# Patient Record
Sex: Female | Born: 1970 | Race: Black or African American | Hispanic: No | Marital: Single | Smoking: Former smoker
Health system: Southern US, Community
[De-identification: ages and names within clinical notes are randomized; demographics above are authoritative.]

## PROBLEM LIST (undated history)

## (undated) DIAGNOSIS — T7840XA Allergy, unspecified, initial encounter: Secondary | ICD-10-CM

## (undated) DIAGNOSIS — Z5189 Encounter for other specified aftercare: Secondary | ICD-10-CM

## (undated) DIAGNOSIS — F419 Anxiety disorder, unspecified: Secondary | ICD-10-CM

## (undated) DIAGNOSIS — B009 Herpesviral infection, unspecified: Secondary | ICD-10-CM

## (undated) DIAGNOSIS — D649 Anemia, unspecified: Secondary | ICD-10-CM

## (undated) DIAGNOSIS — D219 Benign neoplasm of connective and other soft tissue, unspecified: Secondary | ICD-10-CM

## (undated) DIAGNOSIS — N289 Disorder of kidney and ureter, unspecified: Secondary | ICD-10-CM

## (undated) DIAGNOSIS — U071 COVID-19: Secondary | ICD-10-CM

## (undated) DIAGNOSIS — I10 Essential (primary) hypertension: Secondary | ICD-10-CM

## (undated) HISTORY — DX: Anxiety disorder, unspecified: F41.9

## (undated) HISTORY — DX: Herpesviral infection, unspecified: B00.9

## (undated) HISTORY — DX: Allergy, unspecified, initial encounter: T78.40XA

## (undated) HISTORY — DX: Benign neoplasm of connective and other soft tissue, unspecified: D21.9

## (undated) HISTORY — DX: Encounter for other specified aftercare: Z51.89

## (undated) HISTORY — DX: Anemia, unspecified: D64.9

## (undated) HISTORY — PX: LITHOTRIPSY: SUR834

## (undated) HISTORY — DX: COVID-19: U07.1

## (undated) HISTORY — PX: GYNECOLOGIC CRYOSURGERY: SHX857

## (undated) HISTORY — DX: Essential (primary) hypertension: I10

## (undated) HISTORY — PX: OTHER SURGICAL HISTORY: SHX169

---

## 2001-09-10 ENCOUNTER — Emergency Department (HOSPITAL_COMMUNITY): Admission: EM | Admit: 2001-09-10 | Discharge: 2001-09-10 | Payer: Self-pay | Admitting: *Deleted

## 2001-09-12 ENCOUNTER — Encounter: Payer: Self-pay | Admitting: Family Medicine

## 2001-09-12 ENCOUNTER — Ambulatory Visit (HOSPITAL_COMMUNITY): Admission: RE | Admit: 2001-09-12 | Discharge: 2001-09-12 | Payer: Self-pay | Admitting: Family Medicine

## 2001-11-06 ENCOUNTER — Other Ambulatory Visit: Admission: RE | Admit: 2001-11-06 | Discharge: 2001-11-06 | Payer: Self-pay

## 2002-03-27 ENCOUNTER — Ambulatory Visit (HOSPITAL_COMMUNITY): Admission: RE | Admit: 2002-03-27 | Discharge: 2002-03-27 | Payer: Self-pay | Admitting: Family Medicine

## 2002-03-27 ENCOUNTER — Encounter: Payer: Self-pay | Admitting: Family Medicine

## 2003-08-16 ENCOUNTER — Other Ambulatory Visit: Admission: RE | Admit: 2003-08-16 | Discharge: 2003-08-16 | Payer: Self-pay | Admitting: Family Medicine

## 2014-05-05 ENCOUNTER — Encounter: Payer: Self-pay | Admitting: Gynecology

## 2014-05-05 ENCOUNTER — Ambulatory Visit (INDEPENDENT_AMBULATORY_CARE_PROVIDER_SITE_OTHER): Payer: 59 | Admitting: Gynecology

## 2014-05-05 VITALS — BP 130/80 | Ht 69.0 in | Wt 217.0 lb

## 2014-05-05 DIAGNOSIS — D251 Intramural leiomyoma of uterus: Secondary | ICD-10-CM

## 2014-05-05 DIAGNOSIS — N898 Other specified noninflammatory disorders of vagina: Secondary | ICD-10-CM

## 2014-05-05 DIAGNOSIS — Z113 Encounter for screening for infections with a predominantly sexual mode of transmission: Secondary | ICD-10-CM

## 2014-05-05 DIAGNOSIS — N920 Excessive and frequent menstruation with regular cycle: Secondary | ICD-10-CM

## 2014-05-05 DIAGNOSIS — Z308 Encounter for other contraceptive management: Secondary | ICD-10-CM

## 2014-05-05 LAB — WET PREP FOR TRICH, YEAST, CLUE
Trich, Wet Prep: NONE SEEN
Yeast Wet Prep HPF POC: NONE SEEN

## 2014-05-05 MED ORDER — CLINDAMYCIN PHOSPHATE 2 % VA CREA: 1.0000 | TOPICAL_CREAM | Freq: Every day | VAGINAL | Status: DC

## 2014-05-05 NOTE — Progress Notes (Signed)
Danielle Hawkins 08/19/1970 [id number]        43 y.o.  G0P0 new patient presents with several issues:  1. Vaginal discharge with odor over the past several weeks. Some irritation associated with this. No real itching. No urinary symptoms such as frequency discharge irritation. 2. Requests STD screening. Is in a new relationship and requests screening. No known exposure.  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 3. History of menorrhagia with leiomyoma. Started on oral contraceptives for control. Apparently had significant menorrhagia requiring transfusion in the past. Menses are lighter now.  Past medical history,surgical history, problem list, medications, allergies, family history and social history were all reviewed and documented in the EPIC chart.  Directed ROS with pertinent positives and negatives documented in the history of present illness/assessment and plan.  Exam: [name] assistant General appearance:  Normal Spine straight without CVA tenderness Abdomen soft nontender without masses guarding rebound organomegaly. Pelvic external BUS vagina with white discharge. Cervix grossly normal. Uterus irregular consistent with leiomyoma but grossly normal to upper limits of normal in size. Adnexa without masses or tenderness.  Perineum/perianal region grossly normal without evidence of lesions or condyloma.  Assessment/Plan:  43 y.o. G0P0 with :  1. White discharge positive for bacterial vaginosis on wet prep. Options for treatment reviewed and patient elected for Cleocin vaginal cream nightly x7 days. Follow up if symptoms persist, worsen or recur. 2. STD screening. GC/Chlamydia, hepatitis B, hepatitis C, RPR, HIV ordered. I reviewed the issues of sero-positive HSV which could reflect "cold sores" and not be genital. She's never had a clinical outbreak. I also reviewed the possibility of subclinical shedding genitally. At this point she'll report any lesions if  they occur. 3. History of leiomyoma/menorrhagia/contraceptive management. On oral contraceptives for control. Doing well. She does give a history of occasional smoking when she goes out socially which amounts to several cigarettes monthly. I reviewed with her the increased risk of blood clots to include stroke heart attack DVT associated with smoking and birth control pills particularly over the age of 77. It does not sound like she smokes heavily but I discussed with her no guarantees that this is not adversely affecting her risk. Alternatives for contraception reviewed to include Mirena IUD which would also address the menorrhagia issue hopefully. She was told that where her fibroids were in the past that it would be dangerous for her to achieve pregnancy. Unclear if they were referring to a submucosal location. Ultimately I recommended she proceed with sonohysterogram for better definition of her baseline leiomyoma and to assess the cavity.  If submucous myomas present options for hysteroscopic myomectomy reviewed. If cavity is clear options for Mirena IUD discussed. I strongly recommended she consider this and she is going to think this over.  4. Health maintenance. Patient is overdue for an annual exam and routine blood workup record and she scheduled this and she will go and do so.  Does report being up-to-date on her mammogram. She does have a history of abnormal Pap smear requiring cryosurgery in her early 54s but is unclear of the severity of the atypia. Her Pap smears are reportedly normal since then which [place].    [name], 3:32 PM 05/05/2014

## 2014-05-05 NOTE — Patient Instructions (Addendum)
Use the Cleocin vaginal cream nightly for 7 days.  Follow up if the vaginal discharge or irritation continues. Follow up for ultrasound as scheduled. Schedule a full GYN checkup exam.  Lab results are available on My Chart within one to 2 weeks.

## 2014-05-06 LAB — HEPATITIS C ANTIBODY: HCV Ab: NEGATIVE

## 2014-05-06 LAB — URINALYSIS W MICROSCOPIC + REFLEX CULTURE
Bacteria, UA: NONE SEEN
Bilirubin Urine: NEGATIVE
CRYSTALS: NONE SEEN
Casts: NONE SEEN
GLUCOSE, UA: NEGATIVE mg/dL
Ketones, ur: NEGATIVE mg/dL
Leukocytes, UA: NEGATIVE
Nitrite: NEGATIVE
Protein, ur: NEGATIVE mg/dL
SPECIFIC GRAVITY, URINE: 1.023 (ref 1.005–1.030)
SQUAMOUS EPITHELIAL / LPF: NONE SEEN
UROBILINOGEN UA: 0.2 mg/dL (ref 0.0–1.0)
pH: 6 (ref 5.0–8.0)

## 2014-05-06 LAB — HIV ANTIBODY (ROUTINE TESTING W REFLEX): HIV 1&2 Ab, 4th Generation: NONREACTIVE

## 2014-05-06 LAB — HEPATITIS B SURFACE ANTIGEN: Hepatitis B Surface Ag: NEGATIVE

## 2014-05-06 LAB — RPR

## 2014-05-07 ENCOUNTER — Telehealth: Payer: Self-pay | Admitting: *Deleted

## 2014-05-07 LAB — URINE CULTURE
Colony Count: NO GROWTH
ORGANISM ID, BACTERIA: NO GROWTH

## 2014-05-07 LAB — GC/CHLAMYDIA PROBE AMP
CT Probe RNA: NEGATIVE
GC Probe RNA: NEGATIVE

## 2014-05-07 NOTE — Telephone Encounter (Signed)
Pt called requesting lab results from 05/05/14 GC/Chlamydia, i called pt and informed result not back yet.

## 2014-05-10 ENCOUNTER — Other Ambulatory Visit: Payer: Self-pay | Admitting: *Deleted

## 2014-05-10 DIAGNOSIS — R319 Hematuria, unspecified: Secondary | ICD-10-CM

## 2014-05-11 ENCOUNTER — Other Ambulatory Visit: Payer: 59

## 2014-05-11 DIAGNOSIS — R319 Hematuria, unspecified: Secondary | ICD-10-CM

## 2014-05-12 LAB — URINALYSIS W MICROSCOPIC + REFLEX CULTURE
BACTERIA UA: NONE SEEN
Bilirubin Urine: NEGATIVE
CASTS: NONE SEEN
CRYSTALS: NONE SEEN
Glucose, UA: NEGATIVE mg/dL
KETONES UR: NEGATIVE mg/dL
LEUKOCYTES UA: NEGATIVE
NITRITE: NEGATIVE
Protein, ur: NEGATIVE mg/dL
SPECIFIC GRAVITY, URINE: 1.024 (ref 1.005–1.030)
Urobilinogen, UA: 0.2 mg/dL (ref 0.0–1.0)
pH: 6 (ref 5.0–8.0)

## 2014-05-13 ENCOUNTER — Telehealth: Payer: Self-pay | Admitting: *Deleted

## 2014-05-13 LAB — URINE CULTURE: Colony Count: 9000

## 2014-05-13 NOTE — Telephone Encounter (Signed)
Pt was told to come back and repeat u/a on 05/05/14 due to fair amount of blood in urine, pt had repeat on 05/11/14 still shows blood. Pt said she has history of kidney stones and lipotripsy done back in march 2015. Please advise

## 2014-05-13 NOTE — Telephone Encounter (Signed)
It sounds like she was well evaluated by urology and I do not think further workup at this point is needed.  I am culturing it just to make sure it does not grow out bacteria.

## 2014-05-13 NOTE — Telephone Encounter (Signed)
Left the below on pt voicemail. 

## 2014-05-18 ENCOUNTER — Telehealth: Payer: Self-pay

## 2014-05-18 NOTE — Telephone Encounter (Signed)
Patient called back in voice mail and said she was having white discharge and tingling but not itching and no odor. She told me she had scheduled appt with Dr. Loetta Rough for Thursday because she wants to make sure what is going on.  She said I only needed to call her back if i needed to.  I will not call her back since office visit best advice and she has it scheduled.

## 2014-05-18 NOTE — Telephone Encounter (Signed)
Patient called stating she finished Cleocin cream but now suspects possible yeast infection due to white discharge. I called her back and left message to call me so I can get more detail about her symptoms.

## 2014-05-20 ENCOUNTER — Encounter: Payer: Self-pay | Admitting: Gynecology

## 2014-05-20 ENCOUNTER — Ambulatory Visit (INDEPENDENT_AMBULATORY_CARE_PROVIDER_SITE_OTHER): Payer: 59 | Admitting: Gynecology

## 2014-05-20 DIAGNOSIS — B3731 Acute candidiasis of vulva and vagina: Secondary | ICD-10-CM

## 2014-05-20 DIAGNOSIS — B373 Candidiasis of vulva and vagina: Secondary | ICD-10-CM

## 2014-05-20 DIAGNOSIS — N898 Other specified noninflammatory disorders of vagina: Secondary | ICD-10-CM

## 2014-05-20 LAB — WET PREP FOR TRICH, YEAST, CLUE: TRICH WET PREP: NONE SEEN

## 2014-05-20 MED ORDER — TERCONAZOLE 0.8 % VA CREA: 1.0000 | TOPICAL_CREAM | Freq: Every day | VAGINAL | Status: DC

## 2014-05-20 NOTE — Patient Instructions (Signed)
Use the Terazol three-day cream nightly 3 days. Follow up if symptoms persist, worsen or recur.

## 2014-05-20 NOTE — Progress Notes (Signed)
Danielle Hawkins 06/10/1971 [id number]        43 y.o.  G0P0 Presents with ongoing vaginal discharge. Was treated for bacterial vaginosis last month and notes that her odor and heavier discharge resolved but now she has more of an itch with white discharge. No urinary symptoms such as frequency dysuria urgency.  Past medical history,surgical history, problem list, medications, allergies, family history and social history were all reviewed and documented in the EPIC chart.  Directed ROS with pertinent positives and negatives documented in the history of present illness/assessment and plan.  Exam: [name] assistant General appearance:  Normal Abdomen soft without masses guarding rebound Pelvic external be less vagina with white clumpy discharge. Cervix normal. Uterus normal size midline mobile nontender. Adnexa without masses or tenderness.  Assessment/Plan:  43 y.o. G0P0 history, exam and wet prep consistent with yeast vaginitis. Is allergic to oral Diflucan but can use vaginal yeast creams. Terazol 3 day cream prescribed. Follow up if symptoms persist, worsen or recur.     [name], 12:17 PM 05/20/2014

## 2014-06-07 ENCOUNTER — Telehealth: Payer: Self-pay

## 2014-06-07 NOTE — Telephone Encounter (Signed)
C/o yeast infection symptoms. Danielle Hawkins they never went completely away after treatment. She said she had some sort of reaction to Terazol 7 that caused her to be very swollen and itching. She called Dr. On call who said impossible to know if swelling and itching from yeast or med reaction so she told her to d/c it. She told her that she had used it long enough (3-4 days) that it should have cleared up the yeast.  Patient wondered what you recommend now and also asked why she keeps getting yeast infections. (of note she is allergic to Diflucan)

## 2014-06-08 ENCOUNTER — Other Ambulatory Visit: Payer: Self-pay | Admitting: Gynecology

## 2014-06-08 MED ORDER — NONFORMULARY OR COMPOUNDED ITEM: Status: DC

## 2014-06-08 NOTE — Telephone Encounter (Signed)
Left message to call.

## 2014-06-08 NOTE — Telephone Encounter (Signed)
I would recommend trial of boric acid suppositories 600 mg intravaginal 3 times weekly #36 for 3 months and see if this doesn't suppress her.

## 2014-06-08 NOTE — Telephone Encounter (Signed)
Patient called. Advised of Dr. Dorette Grate recommendation and Rx sent.

## 2014-06-09 ENCOUNTER — Telehealth: Payer: Self-pay | Admitting: *Deleted

## 2014-06-09 NOTE — Telephone Encounter (Signed)
Pt walked in yesterday with a question on how to insert the boric acid capsules without using her finger. I left a message last night and this morning to return the call. She can use a plastic tampon applicator or ask the pharmacist for an applicator. I did not leave this message on her VM. I did let [name] at our front desk know this in case the patient walks in for the answer again. If there are more questions the pt or [name] can get in touch with me. KW CMA

## 2014-06-09 NOTE — Telephone Encounter (Signed)
Pt called back - I went over the tampon applicator (preferably the plastic one) use for inserting Boric Acid capsules. SHe asked if she could use it back to back for several days since has Yeast symptoms now, Verbally per TF yes for 7 days then three times a week. Left that response and tampons at front desk for pt to pick up today. KW CMA

## 2015-01-06 ENCOUNTER — Ambulatory Visit (INDEPENDENT_AMBULATORY_CARE_PROVIDER_SITE_OTHER): Payer: 59 | Admitting: Family Medicine

## 2015-01-06 VITALS — BP 154/86 | HR 82 | Temp 98.3°F | Resp 16 | Ht 69.0 in | Wt 215.0 lb

## 2015-01-06 DIAGNOSIS — F418 Other specified anxiety disorders: Secondary | ICD-10-CM | POA: Diagnosis not present

## 2015-01-06 DIAGNOSIS — R002 Palpitations: Secondary | ICD-10-CM

## 2015-01-06 DIAGNOSIS — Z8249 Family history of ischemic heart disease and other diseases of the circulatory system: Secondary | ICD-10-CM | POA: Diagnosis not present

## 2015-01-06 DIAGNOSIS — R9431 Abnormal electrocardiogram [ECG] [EKG]: Secondary | ICD-10-CM | POA: Diagnosis not present

## 2015-01-06 DIAGNOSIS — R42 Dizziness and giddiness: Secondary | ICD-10-CM

## 2015-01-06 DIAGNOSIS — F4329 Adjustment disorder with other symptoms: Secondary | ICD-10-CM | POA: Diagnosis not present

## 2015-01-06 DIAGNOSIS — I10 Essential (primary) hypertension: Secondary | ICD-10-CM | POA: Diagnosis not present

## 2015-01-06 LAB — POCT CBC
Granulocyte percent: 73.7 %G (ref 37–80)
HEMATOCRIT: 35.3 % — AB (ref 37.7–47.9)
HEMOGLOBIN: 11.4 g/dL — AB (ref 12.2–16.2)
Lymph, poc: 1.3 (ref 0.6–3.4)
MCH, POC: 25 pg — AB (ref 27–31.2)
MCHC: 32.2 g/dL (ref 31.8–35.4)
MCV: 77.6 fL — AB (ref 80–97)
MID (cbc): 0.2 (ref 0–0.9)
MPV: 7.6 fL (ref 0–99.8)
POC Granulocyte: 4.1 (ref 2–6.9)
POC LYMPH %: 22.5 % (ref 10–50)
POC MID %: 3.8 %M (ref 0–12)
Platelet Count, POC: 334 10*3/uL (ref 142–424)
RBC: 4.55 M/uL (ref 4.04–5.48)
RDW, POC: 14.5 %
WBC: 5.6 10*3/uL (ref 4.6–10.2)

## 2015-01-06 LAB — GLUCOSE, POCT (MANUAL RESULT ENTRY): POC GLUCOSE: 84 mg/dL (ref 70–99)

## 2015-01-06 MED ORDER — METOPROLOL TARTRATE 25 MG PO TABS: 25.0000 mg | ORAL_TABLET | Freq: Two times a day (BID) | ORAL | Status: DC

## 2015-01-06 MED ORDER — ALPRAZOLAM 0.5 MG PO TABS: 0.5000 mg | ORAL_TABLET | Freq: Two times a day (BID) | ORAL | Status: DC | PRN

## 2015-01-06 MED ORDER — CHLORTHALIDONE 25 MG PO TABS: 12.5000 mg | ORAL_TABLET | Freq: Every day | ORAL | Status: DC

## 2015-01-06 NOTE — Progress Notes (Addendum)
Subjective:  This chart was scribed for [name], MD by [facility], medical scribe at [facility].The patient was seen in exam room 11 and the patient's care was started at 4:34 PM.   Patient ID: Danielle Hawkins, female    DOB: 1971/01/08, 44 y.o.   MRN: [id number] Chief Complaint  Patient presents with  . Dizziness    started about an hour ago  . Hypertension    high BP started about 2 wks ago. anxiety attack brought this on.  new meds are not helping   HPI HPI Comments: Danielle Hawkins is a 44 y.o. female who presents to [facility] complaining of dizziness onset one hour pta. She is a new patient here.   Hypertension, dizziness: She is concerned about an increased in blood pressure with associated dizziness, heart palpation and lightheadedness ongoing for the last two months. Wrist chuff reading last week 160/90. Pharmacist checked her blood pressure that day which read 166/90. After the pharmacy she went to [facility] ED which showed similar blood pressure readings. She developed a headache, and anxiety as blood pressure increased. The highest blood pressure was 200/115. An IV was started and she given an oral metoprolol and medication for anxiety. She also had blood work, EKG, CT of the head and chest x-ray which were all normal. Since leaving the hospital blood pressure has been running around 150/80-90 lowest is 145/84. She does not believe her medication is working. Pt states she has white coat syndrome. She has an appointment with Domenick Bookbinder for her elevated blood pressure on 07/11. Pt has a strong family history of hypertension. She does not have a history of diabetes.  FH of cardiac disease - mom with stents in early to mid 50's.   Anxiety: History of anxiety treated with xanax as needed prescribed by her OBGYN. Lately she takes xanax every other day. She has a history of panic attacks. Pt has only taken xanax for relief. Stressed because she  is taking care of her mom over this past year. No suicidal ideation, self injury, alcohol use or illicit drug use.  There are no active problems to display for this patient.  Past Medical History  Diagnosis Date  . Fibroid   . Anemia   . Anxiety   . Encounter for blood transfusion   . HSV infection     seropositive.  Negative clinical history  . Allergy   . Blood transfusion without reported diagnosis   . Hypertension    Past Surgical History  Procedure Laterality Date  . Lithotripsy    . Keloid removal    . Gynecologic cryosurgery  early 20's   Allergies  Allergen Reactions  . Diflucan [Fluconazole]   . Niacin And Related   . Other Itching    IV dye  . Percocet [Oxycodone-Acetaminophen]     Itching and insomnia  . Red Dye   . Tramadol Itching   Prior to Admission medications   Medication Sig Start Date End Date Taking? Authorizing Provider  ALPRAZolam Duanne Moron) 1 MG tablet Take 1 mg by mouth at bedtime as needed for anxiety.   Yes Historical Provider, MD  metoprolol tartrate (LOPRESSOR) 25 MG tablet Take 25 mg by mouth 2 (two) times daily.   Yes Historical Provider, MD  norgestimate-ethinyl estradiol (ORTHO-CYCLEN,SPRINTEC,PREVIFEM) 0.25-35 MG-MCG tablet Take 1 tablet by mouth daily.   Yes Historical Provider, MD  fluticasone (VERAMYST) 27.5 MCG/SPRAY nasal spray Place 2 sprays into the  nose daily.    Historical Provider, MD  NONFORMULARY OR COMPOUNDED ITEM Boric Acid Suppositories 674m #36 S: insert one caps vaginally 3x weekly. Patient not taking: Reported on 01/06/2015 06/08/14   TAnastasio Auerbach MD  terconazole (TERAZOL 3) 0.8 % vaginal cream Place 1 applicator vaginally at bedtime. For 3 nights Patient not taking: Reported on 01/06/2015 05/20/14   TAnastasio Auerbach MD   History   Social History  . Marital Status: Single    Spouse Name: N/A  . Number of Children: N/A  . Years of Education: N/A   Occupational History  . Not on file.   Social History Main  Topics  . Smoking status: Former SResearch scientist (life sciences) . Smokeless tobacco: Never Used  . Alcohol Use: 0.0 oz/week    0 Standard drinks or equivalent per week     Comment: Rare  . Drug Use: No  . Sexual Activity: Yes    Birth Control/ Protection: Pill   Other Topics Concern  . Not on file   Social History Narrative   Review of Systems  Constitutional: Negative for fatigue and unexpected weight change.  Respiratory: Negative for chest tightness and shortness of breath.   Cardiovascular: Negative for chest pain, palpitations and leg swelling.  Gastrointestinal: Negative for abdominal pain and blood in stool.  Neurological: Positive for dizziness and light-headedness. Negative for syncope and headaches.  Psychiatric/Behavioral: The patient is nervous/anxious.       Objective:  BP 154/86 mmHg  Pulse 82  Temp(Src) 98.3 F (36.8 C) (Oral)  Resp 16  Ht 5' 9"  (1.753 m)  Wt 215 lb (97.523 kg)  BMI 31.74 kg/m2  SpO2 98%  LMP 12/30/2014 Physical Exam  Constitutional: She is oriented to person, place, and time. She appears well-developed and well-nourished. No distress.  HENT:  Head: Normocephalic and atraumatic.  Eyes: Conjunctivae and EOM are normal. Pupils are equal, round, and reactive to light.  Neck: Normal range of motion. Carotid bruit is not present.  Cardiovascular: Normal rate, regular rhythm, normal heart sounds and intact distal pulses.   Pulmonary/Chest: Effort normal and breath sounds normal. No respiratory distress.  Abdominal: Soft. She exhibits no pulsatile midline mass. There is no tenderness.  Musculoskeletal: Normal range of motion.  Neurological: She is alert and oriented to person, place, and time.  Skin: Skin is warm and dry.  Psychiatric: She has a normal mood and affect. Her behavior is normal.  Nursing note and vitals reviewed. EKG: sinus rhythm, rate 78, nonspecific ST depression - inferolaterally.  No initial EKG for comparison [facility]   Results for orders placed or performed in visit on 01/06/15  POCT glucose (manual entry)  Result Value Ref Range   POC Glucose 84 70 - 99 mg/dl  POCT CBC  Result Value Ref Range   WBC 5.6 4.6 - 10.2 K/uL   Lymph, poc 1.3 0.6 - 3.4   POC LYMPH PERCENT 22.5 10 - 50 %L   MID (cbc) 0.2 0 - 0.9   POC MID % 3.8 0 - 12 %M   POC Granulocyte 4.1 2 - 6.9   Granulocyte percent 73.7 37 - 80 %G   RBC 4.55 4.04 - 5.48 M/uL   Hemoglobin 11.4 (A) 12.2 - 16.2 g/dL   HCT, POC 35.3 (A) 37.7 - 47.9 %   MCV 77.6 (A) 80 - 97 fL   MCH, POC 25.0 (A) 27 - 31.2 pg   MCHC 32.2 31.8 - 35.4 g/dL  RDW, POC 14.5 %   Platelet Count, POC 334 142 - 424 K/uL   MPV 7.6 0 - 99.8 fL  hx of borderline anemia after heavy menses, hx of fibroids - coming off period now. Has had anemia in past.     Assessment & Plan:   Danielle Hawkins is a 44 y.o. female Essential hypertension, Nonspecific abnormal electrocardiogram (ECG) (EKG), family history of cardiovascular disease - Plan: EKG 56-DJSH, Basic metabolic panel, chlorthalidone (HYGROTON) 25 MG tablet, Ambulatory referral to Cardiology, metoprolol tartrate (LOPRESSOR) 25 MG tablet  - decreased control. With possible abnormal EKG and FH of heart disease, will continue betablocker to decrease workload, add ASA 34m QD, add chlorthalidone 12.517mQD for HTN.  -refer to cardiology for outpatient eval at this point as no chest pains, but ER/911 precautions discussed if any chest pain, DOE, or new symptoms prior to cardiology eval.   Situational anxiety, with Stress and adjustment reaction  -stress mgt discussed and handout provided. May have underlying generalized anxiety with intermittent panic vs. anxiety attacks. Will start with counseling, and can continue Xanax prn for now (refilled at 0.8m28mose), but consider SSRi if persistent use/need for xanax.  Can follow up with me or scheduled follow up with PCP.   Lightheadedness, Palpitations - Plan: TSH, EKG  12-Lead  -TSH pending. Borderline HGB, but improved form her report compared to prior readings - hx of fibroids and heavy menses.  Can follow up to discuss this further.    -cardiology eval as above and RTC/ER precautions.    Meds ordered this encounter  Medications  . DISCONTD: metoprolol tartrate (LOPRESSOR) 25 MG tablet    Sig: Take 25 mg by mouth 2 (two) times daily.  . AMarland KitchenPRAZolam (XANAX) 0.5 MG tablet    Sig: Take 1 tablet (0.5 mg total) by mouth 2 (two) times daily as needed for anxiety.    Dispense:  30 tablet    Refill:  0  . chlorthalidone (HYGROTON) 25 MG tablet    Sig: Take 0.5 tablets (12.5 mg total) by mouth daily.    Dispense:  30 tablet    Refill:  1  . metoprolol tartrate (LOPRESSOR) 25 MG tablet    Sig: Take 1 tablet (25 mg total) by mouth 2 (two) times daily.    Dispense:  60 tablet    Refill:  1   Patient Instructions  You do have a possible abnormal EKG.  Continue same dose of metoprolol for now, aspirin 52m66mch day, and adding chlorthalidone (diuretic) for blood pressure.   Keep follow up with provider on the 11th to discuss blood pressure.   Work on relaIT traineree information below, and call counselor for appointment. I can refill xanax for now, but if persistent need for xanax - may want to start a daily medication such as Prozac.  Can follow up with me or there provider in next few weeks to discuss this.  AaroVivia [phone] Chaco:[phone] and Stress Management Stress is a normal reaction to life events. It is what you feel when life demands more than you are used to or more than you can handle. Some stress can be useful. For example, the stress reaction can help you catch the last bus of the day, study for a test, or meet a deadline at work. But stress that occurs too often or for too long can cause problems. It can affect your emotional health and interfere with relationships and normal daily activities.  Too much stress  can weaken your immune system and increase your risk for physical illness. If you already have a medical problem, stress can make it worse. CAUSES  All sorts of life events may cause stress. An event that causes stress for one person may not be stressful for another person. Major life events commonly cause stress. These may be positive or negative. Examples include losing your job, moving into a new home, getting married, having a baby, or losing a loved one. Less obvious life events may also cause stress, especially if they occur day after day or in combination. Examples include working long hours, driving in traffic, caring for children, being in debt, or being in a difficult relationship. SIGNS AND SYMPTOMS Stress may cause emotional symptoms including, the following:  Anxiety. This is feeling worried, afraid, on edge, overwhelmed, or out of control.  Anger. This is feeling irritated or impatient.  Depression. This is feeling sad, down, helpless, or guilty.  Difficulty focusing, remembering, or making decisions. Stress may cause physical symptoms, including the following:   Aches and pains. These may affect your head, neck, back, stomach, or other areas of your body.  Tight muscles or clenched jaw.  Low energy or trouble sleeping. Stress may cause unhealthy behaviors, including the following:   Eating to feel better (overeating) or skipping meals.  Sleeping too little, too much, or both.  Working too much or putting off tasks (procrastination).  Smoking, drinking alcohol, or using drugs to feel better. DIAGNOSIS  Stress is diagnosed through an assessment by your health care provider. Your health care provider will ask questions about your symptoms and any stressful life events.Your health care provider will also ask about your medical history and may order blood tests or other tests. Certain medical conditions and medicine can cause physical symptoms similar to stress. Mental  illness can cause emotional symptoms and unhealthy behaviors similar to stress. Your health care provider may refer you to a mental health professional for further evaluation.  TREATMENT  Stress management is the recommended treatment for stress.The goals of stress management are reducing stressful life events and coping with stress in healthy ways.  Techniques for reducing stressful life events include the following:  Stress identification. Self-monitor for stress and identify what causes stress for you. These skills may help you to avoid some stressful events.  Time management. Set your priorities, keep a calendar of events, and learn to say "no." These tools can help you avoid making too many commitments. Techniques for coping with stress include the following:  Rethinking the problem. Try to think realistically about stressful events rather than ignoring them or overreacting. Try to find the positives in a stressful situation rather than focusing on the negatives.  Exercise. Physical exercise can release both physical and emotional tension. The key is to find a form of exercise you enjoy and do it regularly.  Relaxation techniques. These relax the body and mind. Examples include yoga, meditation, tai chi, biofeedback, deep breathing, progressive muscle relaxation, listening to music, being out in nature, journaling, and other hobbies. Again, the key is to find one or more that you enjoy and can do regularly.  Healthy lifestyle. Eat a balanced diet, get plenty of sleep, and do not smoke. Avoid using alcohol or drugs to relax.  Strong support network. Spend time with family, friends, or other people you enjoy being around.Express your feelings and talk things over with someone you trust. Counseling or talktherapy with a mental health professional may  be helpful if you are having difficulty managing stress on your own. Medicine is typically not recommended for the treatment of stress.Talk to  your health care provider if you think you need medicine for symptoms of stress. HOME CARE INSTRUCTIONS  Keep all follow-up visits as directed by your health care provider.  Take all medicines as directed by your health care provider. SEEK MEDICAL CARE IF:  Your symptoms get worse or you start having new symptoms.  You feel overwhelmed by your problems and can no longer manage them on your own. SEEK IMMEDIATE MEDICAL CARE IF:  You feel like hurting yourself or someone else. Document Released: 12/26/2000 Document Revised: 11/16/2013 Document Reviewed: 02/24/2013 [facility] Patient Information 2015 [place], [place]. This information is not intended to replace advice given to you by your health care provider. Make sure you discuss any questions you have with your health care provider.  Hypertension Hypertension, commonly called high blood pressure, is when the force of blood pumping through your arteries is too strong. Your arteries are the blood vessels that carry blood from your heart throughout your body. A blood pressure reading consists of a higher number over a lower number, such as 110/72. The higher number (systolic) is the pressure inside your arteries when your heart pumps. The lower number (diastolic) is the pressure inside your arteries when your heart relaxes. Ideally you want your blood pressure below 120/80. Hypertension forces your heart to work harder to pump blood. Your arteries may become narrow or stiff. Having hypertension puts you at risk for heart disease, stroke, and other problems.  RISK FACTORS Some risk factors for high blood pressure are controllable. Others are not.  Risk factors you cannot control include:   Race. You may be at higher risk if you are African American.  Age. Risk increases with age.  Gender. Men are at higher risk than women before age 20 years. After age 57, women are at higher risk than men. Risk factors you can control include:  Not getting  enough exercise or physical activity.  Being overweight.  Getting too much fat, sugar, calories, or salt in your diet.  Drinking too much alcohol. SIGNS AND SYMPTOMS Hypertension does not usually cause signs or symptoms. Extremely high blood pressure (hypertensive crisis) may cause headache, anxiety, shortness of breath, and nosebleed. DIAGNOSIS  To check if you have hypertension, your health care provider will measure your blood pressure while you are seated, with your arm held at the level of your heart. It should be measured at least twice using the same arm. Certain conditions can cause a difference in blood pressure between your right and left arms. A blood pressure reading that is higher than normal on one occasion does not mean that you need treatment. If one blood pressure reading is high, ask your health care provider about having it checked again. TREATMENT  Treating high blood pressure includes making lifestyle changes and possibly taking medicine. Living a healthy lifestyle can help lower high blood pressure. You may need to change some of your habits. Lifestyle changes may include:  Following the DASH diet. This diet is high in fruits, vegetables, and whole grains. It is low in salt, red meat, and added sugars.  Getting at least 2 hours of brisk physical activity every week.  Losing weight if necessary.  Not smoking.  Limiting alcoholic beverages.  Learning ways to reduce stress. If lifestyle changes are not enough to get your blood pressure under control, your health care provider may  prescribe medicine. You may need to take more than one. Work closely with your health care provider to understand the risks and benefits. HOME CARE INSTRUCTIONS  Have your blood pressure rechecked as directed by your health care provider.   Take medicines only as directed by your health care provider. Follow the directions carefully. Blood pressure medicines must be taken as prescribed.  The medicine does not work as well when you skip doses. Skipping doses also puts you at risk for problems.   Do not smoke.   Monitor your blood pressure at home as directed by your health care provider. SEEK MEDICAL CARE IF:   You think you are having a reaction to medicines taken.  You have recurrent headaches or feel dizzy.  You have swelling in your ankles.  You have trouble with your vision. SEEK IMMEDIATE MEDICAL CARE IF:  You develop a severe headache or confusion.  You have unusual weakness, numbness, or feel faint.  You have severe chest or abdominal pain.  You vomit repeatedly.  You have trouble breathing. MAKE SURE YOU:   Understand these instructions.  Will watch your condition.  Will get help right away if you are not doing well or get worse. Document Released: 07/02/2005 Document Revised: 11/16/2013 Document Reviewed: 04/24/2013 [facility] Patient Information 2015 [place], [place]. This information is not intended to replace advice given to you by your health care provider. Make sure you discuss any questions you have with your health care provider.     I personally performed the services described in this documentation, which was scribed in my presence. The recorded information has been reviewed and considered, and addended by me as needed.

## 2015-01-06 NOTE — Patient Instructions (Addendum)
You do have a possible abnormal EKG.  Continue same dose of metoprolol for now, aspirin 17m each day, and adding chlorthalidone (diuretic) for blood pressure.   Keep follow up with provider on the 11th to discuss blood pressure.   Work on rIT trainer- see information below, and call counselor for appointment. I can refill xanax for now, but if persistent need for xanax - may want to start a daily medication such as Prozac.  Can follow up with me or there provider in next few weeks to discuss this.  AVivia Budge [id number] [place]:[phone] Stress and Stress Management Stress is a normal reaction to life events. It is what you feel when life demands more than you are used to or more than you can handle. Some stress can be useful. For example, the stress reaction can help you catch the last bus of the day, study for a test, or meet a deadline at work. But stress that occurs too often or for too long can cause problems. It can affect your emotional health and interfere with relationships and normal daily activities. Too much stress can weaken your immune system and increase your risk for physical illness. If you already have a medical problem, stress can make it worse. CAUSES  All sorts of life events may cause stress. An event that causes stress for one person may not be stressful for another person. Major life events commonly cause stress. These may be positive or negative. Examples include losing your job, moving into a new home, getting married, having a baby, or losing a loved one. Less obvious life events may also cause stress, especially if they occur day after day or in combination. Examples include working long hours, driving in traffic, caring for children, being in debt, or being in a difficult relationship. SIGNS AND SYMPTOMS Stress may cause emotional symptoms including, the following:  Anxiety. This is feeling worried, afraid, on edge, overwhelmed, or out of  control.  Anger. This is feeling irritated or impatient.  Depression. This is feeling sad, down, helpless, or guilty.  Difficulty focusing, remembering, or making decisions. Stress may cause physical symptoms, including the following:   Aches and pains. These may affect your head, neck, back, stomach, or other areas of your body.  Tight muscles or clenched jaw.  Low energy or trouble sleeping. Stress may cause unhealthy behaviors, including the following:   Eating to feel better (overeating) or skipping meals.  Sleeping too little, too much, or both.  Working too much or putting off tasks (procrastination).  Smoking, drinking alcohol, or using drugs to feel better. DIAGNOSIS  Stress is diagnosed through an assessment by your health care provider. Your health care provider will ask questions about your symptoms and any stressful life events.Your health care provider will also ask about your medical history and may order blood tests or other tests. Certain medical conditions and medicine can cause physical symptoms similar to stress. Mental illness can cause emotional symptoms and unhealthy behaviors similar to stress. Your health care provider may refer you to a mental health professional for further evaluation.  TREATMENT  Stress management is the recommended treatment for stress.The goals of stress management are reducing stressful life events and coping with stress in healthy ways.  Techniques for reducing stressful life events include the following:  Stress identification. Self-monitor for stress and identify what causes stress for you. These skills may help you to avoid some stressful events.  Time management. Set your priorities, keep  a calendar of events, and learn to say "no." These tools can help you avoid making too many commitments. Techniques for coping with stress include the following:  Rethinking the problem. Try to think realistically about stressful events rather  than ignoring them or overreacting. Try to find the positives in a stressful situation rather than focusing on the negatives.  Exercise. Physical exercise can release both physical and emotional tension. The key is to find a form of exercise you enjoy and do it regularly.  Relaxation techniques. These relax the body and mind. Examples include yoga, meditation, tai chi, biofeedback, deep breathing, progressive muscle relaxation, listening to music, being out in nature, journaling, and other hobbies. Again, the key is to find one or more that you enjoy and can do regularly.  Healthy lifestyle. Eat a balanced diet, get plenty of sleep, and do not smoke. Avoid using alcohol or drugs to relax.  Strong support network. Spend time with family, friends, or other people you enjoy being around.Express your feelings and talk things over with someone you trust. Counseling or talktherapy with a mental health professional may be helpful if you are having difficulty managing stress on your own. Medicine is typically not recommended for the treatment of stress.Talk to your health care provider if you think you need medicine for symptoms of stress. HOME CARE INSTRUCTIONS  Keep all follow-up visits as directed by your health care provider.  Take all medicines as directed by your health care provider. SEEK MEDICAL CARE IF:  Your symptoms get worse or you start having new symptoms.  You feel overwhelmed by your problems and can no longer manage them on your own. SEEK IMMEDIATE MEDICAL CARE IF:  You feel like hurting yourself or someone else. Document Released: 12/26/2000 Document Revised: 11/16/2013 Document Reviewed: 02/24/2013 [facility] Patient Information 2015 [place], [place]. This information is not intended to replace advice given to you by your health care provider. Make sure you discuss any questions you have with your health care provider.  Hypertension Hypertension, commonly called high blood  pressure, is when the force of blood pumping through your arteries is too strong. Your arteries are the blood vessels that carry blood from your heart throughout your body. A blood pressure reading consists of a higher number over a lower number, such as 110/72. The higher number (systolic) is the pressure inside your arteries when your heart pumps. The lower number (diastolic) is the pressure inside your arteries when your heart relaxes. Ideally you want your blood pressure below 120/80. Hypertension forces your heart to work harder to pump blood. Your arteries may become narrow or stiff. Having hypertension puts you at risk for heart disease, stroke, and other problems.  RISK FACTORS Some risk factors for high blood pressure are controllable. Others are not.  Risk factors you cannot control include:   Race. You may be at higher risk if you are African American.  Age. Risk increases with age.  Gender. Men are at higher risk than women before age 52 years. After age 40, women are at higher risk than men. Risk factors you can control include:  Not getting enough exercise or physical activity.  Being overweight.  Getting too much fat, sugar, calories, or salt in your diet.  Drinking too much alcohol. SIGNS AND SYMPTOMS Hypertension does not usually cause signs or symptoms. Extremely high blood pressure (hypertensive crisis) may cause headache, anxiety, shortness of breath, and nosebleed. DIAGNOSIS  To check if you have hypertension, your health care provider will  measure your blood pressure while you are seated, with your arm held at the level of your heart. It should be measured at least twice using the same arm. Certain conditions can cause a difference in blood pressure between your right and left arms. A blood pressure reading that is higher than normal on one occasion does not mean that you need treatment. If one blood pressure reading is high, ask your health care provider about having it  checked again. TREATMENT  Treating high blood pressure includes making lifestyle changes and possibly taking medicine. Living a healthy lifestyle can help lower high blood pressure. You may need to change some of your habits. Lifestyle changes may include:  Following the DASH diet. This diet is high in fruits, vegetables, and whole grains. It is low in salt, red meat, and added sugars.  Getting at least 2 hours of brisk physical activity every week.  Losing weight if necessary.  Not smoking.  Limiting alcoholic beverages.  Learning ways to reduce stress. If lifestyle changes are not enough to get your blood pressure under control, your health care provider may prescribe medicine. You may need to take more than one. Work closely with your health care provider to understand the risks and benefits. HOME CARE INSTRUCTIONS  Have your blood pressure rechecked as directed by your health care provider.   Take medicines only as directed by your health care provider. Follow the directions carefully. Blood pressure medicines must be taken as prescribed. The medicine does not work as well when you skip doses. Skipping doses also puts you at risk for problems.   Do not smoke.   Monitor your blood pressure at home as directed by your health care provider. SEEK MEDICAL CARE IF:   You think you are having a reaction to medicines taken.  You have recurrent headaches or feel dizzy.  You have swelling in your ankles.  You have trouble with your vision. SEEK IMMEDIATE MEDICAL CARE IF:  You develop a severe headache or confusion.  You have unusual weakness, numbness, or feel faint.  You have severe chest or abdominal pain.  You vomit repeatedly.  You have trouble breathing. MAKE SURE YOU:   Understand these instructions.  Will watch your condition.  Will get help right away if you are not doing well or get worse. Document Released: 07/02/2005 Document Revised: 11/16/2013  Document Reviewed: 04/24/2013 [facility] Patient Information 2015 [place], [place]. This information is not intended to replace advice given to you by your health care provider. Make sure you discuss any questions you have with your health care provider.

## 2015-01-07 LAB — TSH: TSH: 0.607 u[IU]/mL (ref 0.350–4.500)

## 2015-01-07 LAB — BASIC METABOLIC PANEL
BUN: 12 mg/dL (ref 6–23)
CHLORIDE: 109 meq/L (ref 96–112)
CO2: 26 meq/L (ref 19–32)
Calcium: 9.2 mg/dL (ref 8.4–10.5)
Creat: 0.93 mg/dL (ref 0.50–1.10)
Glucose, Bld: 78 mg/dL (ref 70–99)
Potassium: 4.3 mEq/L (ref 3.5–5.3)
SODIUM: 142 meq/L (ref 135–145)

## 2015-01-22 ENCOUNTER — Telehealth: Payer: Self-pay

## 2015-01-22 DIAGNOSIS — I10 Essential (primary) hypertension: Secondary | ICD-10-CM

## 2015-01-22 NOTE — Telephone Encounter (Signed)
Patient would like a refill of metoprolol tartrate (LOPRESSOR) 25 MG tablet to last her until her cardiologist appointment in August.  She wants to stick with her old BP prescription until she speaks to the cardiologist at her appointment.  CB#: [phone]

## 2015-01-24 ENCOUNTER — Ambulatory Visit: Payer: Self-pay | Admitting: Family

## 2015-01-25 MED ORDER — METOPROLOL TARTRATE 25 MG PO TABS: 25.0000 mg | ORAL_TABLET | Freq: Two times a day (BID) | ORAL | Status: DC

## 2015-01-25 NOTE — Telephone Encounter (Signed)
Not sure when her cardiologist appointment is but I provided 60 tabs of 25mg  metoprolol. Please let me know if she needs more.

## 2015-01-25 NOTE — Telephone Encounter (Signed)
Can we refill? 

## 2015-01-26 NOTE — Telephone Encounter (Signed)
Clld pt - LMOVM of cell advsng to call with Cardiologist appt time and date in August to make sure her medication refill request of Metoprolol 25 mg is adequate until her appt.

## 2015-01-26 NOTE — Telephone Encounter (Signed)
Pt cb and LMOM stating that the cardiologist office got her a RF and her next appt w/them is on 02/17/15, so she does not need for [place] to RF after all. She thanked [place] for the call.

## 2015-05-30 ENCOUNTER — Telehealth: Payer: Self-pay

## 2015-05-30 MED ORDER — ALPRAZOLAM 0.5 MG PO TABS: 0.5000 mg | ORAL_TABLET | Freq: Two times a day (BID) | ORAL | Status: DC | PRN

## 2015-05-30 NOTE — Telephone Encounter (Signed)
When seen June 23rd: "Work on IT trainer - see information below, and call counselor for appointment. I can refill xanax for now, but if persistent need for xanax - may want to start a daily medication such as Prozac. Can follow up with me or there provider in next few weeks to discuss this"  What is her follow up plan?  I can refill once, but OV needed before any further refills.

## 2015-05-30 NOTE — Telephone Encounter (Signed)
Last OV Wendie Agreste, MD at 01/06/2015 4:28 PM

## 2015-05-30 NOTE — Telephone Encounter (Signed)
Pt is requesting a refill of ALPRAZolam (XANAX) 0.5 MG tablet.  [place]

## 2015-05-31 NOTE — Telephone Encounter (Signed)
She will schedule an appt with Dr. Carlota Raspberry to discuss starting Prozac.

## 2015-05-31 NOTE — Telephone Encounter (Signed)
Spoke with pt, advised message. 

## 2015-06-02 NOTE — Telephone Encounter (Signed)
Rx called into [facility]. Left message on her VM.

## 2015-06-02 NOTE — Telephone Encounter (Signed)
Patient is calling because the pharmacy never received a refill and it needs to be resent. The medication is not in the pick up drawer.

## 2015-08-16 ENCOUNTER — Telehealth: Payer: Self-pay

## 2015-08-16 NOTE — Telephone Encounter (Signed)
I have reviewed her chart.  It looks like it has been a while since we have seen her and its good to see hypertensives about every 6 months.  No need to stop or change any meds at this point.  If she is concerned about her BP advise she start a diary, continue all meds, and RTC with her BP diary.  Thank you Junita Push.   Philis Fendt, MS, PA-C 1:01 PM, 08/16/2015

## 2015-08-16 NOTE — Telephone Encounter (Signed)
Pt states she was put on a BIRTH CONTROL MEDICATION and read where it stated it could cause an increase in her BP, we didn't prescribe the BCP's for her but we did the BP medicine Wanted to know what should she discontinue. Please call [phone]

## 2015-08-23 NOTE — Telephone Encounter (Signed)
Pt notified and she will set up an appt.

## 2015-08-23 NOTE — Telephone Encounter (Signed)
LMOM TO CB 

## 2015-09-09 ENCOUNTER — Other Ambulatory Visit: Payer: Self-pay | Admitting: Family Medicine

## 2015-09-12 ENCOUNTER — Other Ambulatory Visit: Payer: Self-pay | Admitting: Family Medicine

## 2015-09-12 NOTE — Telephone Encounter (Signed)
Pt called to check on this RF and LM stating that she knows she needs to RTC but had been told we would fill 1 mos. I will send in RF. Notified pt.

## 2015-10-14 ENCOUNTER — Ambulatory Visit (INDEPENDENT_AMBULATORY_CARE_PROVIDER_SITE_OTHER): Payer: Commercial Managed Care - HMO | Admitting: Family Medicine

## 2015-10-14 VITALS — BP 131/83 | HR 69 | Temp 98.7°F | Resp 16 | Ht 68.5 in | Wt 209.0 lb

## 2015-10-14 DIAGNOSIS — F410 Panic disorder [episodic paroxysmal anxiety] without agoraphobia: Secondary | ICD-10-CM

## 2015-10-14 DIAGNOSIS — I10 Essential (primary) hypertension: Secondary | ICD-10-CM

## 2015-10-14 DIAGNOSIS — F411 Generalized anxiety disorder: Secondary | ICD-10-CM | POA: Diagnosis not present

## 2015-10-14 LAB — BASIC METABOLIC PANEL
BUN: 12 mg/dL (ref 7–25)
CHLORIDE: 105 mmol/L (ref 98–110)
CO2: 25 mmol/L (ref 20–31)
Calcium: 8.9 mg/dL (ref 8.6–10.2)
Creat: 0.92 mg/dL (ref 0.50–1.10)
Glucose, Bld: 68 mg/dL (ref 65–99)
POTASSIUM: 3.6 mmol/L (ref 3.5–5.3)
SODIUM: 140 mmol/L (ref 135–146)

## 2015-10-14 LAB — HM PAP SMEAR: HM Pap smear: NORMAL

## 2015-10-14 MED ORDER — CHLORTHALIDONE 25 MG PO TABS: 12.5000 mg | ORAL_TABLET | Freq: Every day | ORAL | Status: DC

## 2015-10-14 MED ORDER — AMLODIPINE BESYLATE 5 MG PO TABS: 5.0000 mg | ORAL_TABLET | Freq: Every day | ORAL | Status: DC

## 2015-10-14 MED ORDER — ALPRAZOLAM 0.5 MG PO TABS: 0.5000 mg | ORAL_TABLET | Freq: Two times a day (BID) | ORAL | Status: DC | PRN

## 2015-10-14 NOTE — Patient Instructions (Addendum)
IF you received an x-ray today, you will receive an invoice from [facility]. Please contact [facility] at [phone] with questions or concerns regarding your invoice.   IF you received labwork today, you will receive an invoice from [employer]. Please contact [facility] at [phone] with questions or concerns regarding your invoice.   Our billing staff will not be able to assist you with questions regarding bills from these companies.  You will be contacted with the lab results as soon as they are available. The fastest way to get your results is to activate your My Chart account. Instructions are located on the last page of this paperwork. If you have not heard from [place] regarding the results in 2 weeks, please contact this office.    Change metoprolol to amlodipine 1 pill once per day. As with any change in blood pressure medication, make sure you're watching your readings at home and watch for any lightheadedness or dizziness that may be result of too low of blood pressure. If this were happens, let me know and we will adjust her medications. If blood pressure remains over 140/90 with this new medication, then increase the chlorthalidone to one full pill each day.  See information below on stress and stress management, and numbers for counselors. If you need other names for counselors, please let me know. For now can use the Xanax only as needed for panic or anxiety attacks, but if you do have to use this more often, would consider a daily medication. Follow-up with myself or other primary care provider within the next 6 months for recheck of blood pressure and your symptoms.  Vivia Budge: [phone] [place]: [phone] Criss [place]: [phone]  Stress and Stress Management Stress is a normal reaction to life events. It is what you feel when life demands more than you are used to or more than you can handle. Some stress can be useful. For  example, the stress reaction can help you catch the last bus of the day, study for a test, or meet a deadline at work. But stress that occurs too often or for too long can cause problems. It can affect your emotional health and interfere with relationships and normal daily activities. Too much stress can weaken your immune system and increase your risk for physical illness. If you already have a medical problem, stress can make it worse. CAUSES  All sorts of life events may cause stress. An event that causes stress for one person may not be stressful for another person. Major life events commonly cause stress. These may be positive or negative. Examples include losing your job, moving into a new home, getting married, having a baby, or losing a loved one. Less obvious life events may also cause stress, especially if they occur day after day or in combination. Examples include working long hours, driving in traffic, caring for children, being in debt, or being in a difficult relationship. SIGNS AND SYMPTOMS Stress may cause emotional symptoms including, the following:  Anxiety. This is feeling worried, afraid, on edge, overwhelmed, or out of control.  Anger. This is feeling irritated or impatient.  Depression. This is feeling sad, down, helpless, or guilty.  Difficulty focusing, remembering, or making decisions. Stress may cause physical symptoms, including the following:   Aches and pains. These may affect your head, neck, back, stomach, or other areas of your body.  Tight muscles or clenched jaw.  Low energy or trouble sleeping. Stress may  cause unhealthy behaviors, including the following:   Eating to feel better (overeating) or skipping meals.  Sleeping too little, too much, or both.  Working too much or putting off tasks (procrastination).  Smoking, drinking alcohol, or using drugs to feel better. DIAGNOSIS  Stress is diagnosed through an assessment by your health care provider.  Your health care provider will ask questions about your symptoms and any stressful life events.Your health care provider will also ask about your medical history and may order blood tests or other tests. Certain medical conditions and medicine can cause physical symptoms similar to stress. Mental illness can cause emotional symptoms and unhealthy behaviors similar to stress. Your health care provider may refer you to a mental health professional for further evaluation.  TREATMENT  Stress management is the recommended treatment for stress.The goals of stress management are reducing stressful life events and coping with stress in healthy ways.  Techniques for reducing stressful life events include the following:  Stress identification. Self-monitor for stress and identify what causes stress for you. These skills may help you to avoid some stressful events.  Time management. Set your priorities, keep a calendar of events, and learn to say "no." These tools can help you avoid making too many commitments. Techniques for coping with stress include the following:  Rethinking the problem. Try to think realistically about stressful events rather than ignoring them or overreacting. Try to find the positives in a stressful situation rather than focusing on the negatives.  Exercise. Physical exercise can release both physical and emotional tension. The key is to find a form of exercise you enjoy and do it regularly.  Relaxation techniques. These relax the body and mind. Examples include yoga, meditation, tai chi, biofeedback, deep breathing, progressive muscle relaxation, listening to music, being out in nature, journaling, and other hobbies. Again, the key is to find one or more that you enjoy and can do regularly.  Healthy lifestyle. Eat a balanced diet, get plenty of sleep, and do not smoke. Avoid using alcohol or drugs to relax.  Strong support network. Spend time with family, friends, or other people  you enjoy being around.Express your feelings and talk things over with someone you trust. Counseling or talktherapy with a mental health professional may be helpful if you are having difficulty managing stress on your own. Medicine is typically not recommended for the treatment of stress.Talk to your health care provider if you think you need medicine for symptoms of stress. HOME CARE INSTRUCTIONS  Keep all follow-up visits as directed by your health care provider.  Take all medicines as directed by your health care provider. SEEK MEDICAL CARE IF:  Your symptoms get worse or you start having new symptoms.  You feel overwhelmed by your problems and can no longer manage them on your own. SEEK IMMEDIATE MEDICAL CARE IF:  You feel like hurting yourself or someone else.   This information is not intended to replace advice given to you by your health care provider. Make sure you discuss any questions you have with your health care provider.   Document Released: 12/26/2000 Document Revised: 07/23/2014 Document Reviewed: 02/24/2013 [employer] Interactive Patient Education 2016 [employer]  

## 2015-10-14 NOTE — Progress Notes (Signed)
Subjective:  This chart was scribed for Danielle Ray MD, by Tamsen Roers, at [facility].  This patient was seen in room 10 and the patient's care was started at 1:40 PM.   Chief Complaint  Patient presents with  . Follow-up  . Hypertension     Patient ID: Danielle Hawkins, female    DOB: 1971-04-02, 45 y.o.   MRN: [id number]  HPI HPI Comments: Danielle Hawkins is a 45 y.o. female who presents to the [facility] for a follow up regarding her hypertension.  She was referred to cardiology during her last visit in June 2016 and at that visit, she was continued on metoprolol and added chlorthalidone to her medications.  She was also having some palpitations at that time.  ------ Patient would like a refill today for her metoprolol and chlorthalidone. She went to cardiology and had a stress test completed which came back normal.  They also recommended that she change her diet.  There were no concerns at that visit. Denies chest pain, difficulty breathing or shortness of breath but does have occasional dizziness. She is currently compliant with her Metoprolol (2x per day) and Chlorthalidone (1/2 pill - 1 per day) and states that she does not have any new side effects with these. She was put on baby aspirin (by her cardiologist) but states that she stopped taking it as it was causing issues with her menstrual cycle.  Patient saw her OBGYN PA ([facility] on [address]) today and is wondering if it is safe to continue using her birth control in order to control her bleeding from her fibroid tumors. Patient does not smoke.  Lab Results  Component Value Date   CREATININE 0.93 01/06/2015     Anxiety:   Last visit with me was in June of 2016.  She was having some situational anxiety at that time and adjustment reaction. Has been treated with Xanax by her OBGYN for panic attacks. Refill request in November for her Xanax but reccommended follow up in few weeks  to discuss possible SSRI.  -------  She has episodes of dizziness (rarely- about once a month) and this usually occurs during her panic attacks.  She still has some Xanax left and takes about 1-2 pills at a time when she has these attacks. She has 9 pills left currently (had 30 initially) Patient states that her panic attacks happen "out of the blue". She does not have a therapist or counselor at the time and recognizes that she is constantly worrying about things she should not be stressing about.  She describes her situation as if she "is drowning".  Patient would like to receive a list of counselors that she can meet with.      There are no active problems to display for this patient.  Past Medical History  Diagnosis Date  . Fibroid   . Anemia   . Anxiety   . Encounter for blood transfusion   . HSV infection     seropositive.  Negative clinical history  . Allergy   . Blood transfusion without reported diagnosis   . Hypertension    Past Surgical History  Procedure Laterality Date  . Lithotripsy    . Keloid removal    . Gynecologic cryosurgery  early 20's   Allergies  Allergen Reactions  . Diflucan [Fluconazole]   . Niacin And Related   . Other Itching    IV dye  . Percocet [Oxycodone-Acetaminophen]  Itching and insomnia  . Red Dye   . Tramadol Itching   Prior to Admission medications   Medication Sig Start Date End Date Taking? Authorizing Provider  ALPRAZolam Duanne Moron) 0.5 MG tablet Take 1 tablet (0.5 mg total) by mouth 2 (two) times daily as needed for anxiety. 05/30/15  Yes Wendie Agreste, MD  chlorthalidone (HYGROTON) 25 MG tablet Take 0.5 tablets (12.5 mg total) by mouth daily. 01/06/15  Yes Wendie Agreste, MD  metoprolol tartrate (LOPRESSOR) 25 MG tablet Take 1 tablet (25 mg total) by mouth 2 (two) times daily. 01/25/15  Yes Jaynee Eagles, PA-C  metoprolol tartrate (LOPRESSOR) 25 MG tablet TAKE ONE TABLET BY MOUTH TWICE DAILY 09/12/15  Yes Wendie Agreste, MD    norgestimate-ethinyl estradiol (ORTHO-CYCLEN,SPRINTEC,PREVIFEM) 0.25-35 MG-MCG tablet Take 1 tablet by mouth daily.   Yes Historical Provider, MD   Social History   Social History  . Marital Status: Single    Spouse Name: N/A  . Number of Children: N/A  . Years of Education: N/A   Occupational History  . Not on file.   Social History Main Topics  . Smoking status: Former Research scientist (life sciences)  . Smokeless tobacco: Never Used  . Alcohol Use: 0.0 oz/week    0 Standard drinks or equivalent per week     Comment: Rare  . Drug Use: No  . Sexual Activity: Yes    Birth Control/ Protection: Pill   Other Topics Concern  . Not on file   Social History Narrative       Review of Systems  Constitutional: Negative for fever, chills, fatigue and unexpected weight change.  Eyes: Negative for pain, redness and itching.  Respiratory: Negative for cough, choking, chest tightness and shortness of breath.   Cardiovascular: Negative for chest pain, palpitations and leg swelling.  Gastrointestinal: Negative for nausea, vomiting, abdominal pain and blood in stool.  Musculoskeletal: Negative for neck pain and neck stiffness.  Neurological: Positive for dizziness. Negative for syncope, speech difficulty, light-headedness and headaches.  Psychiatric/Behavioral: The patient is nervous/anxious.        Objective:   Physical Exam  Constitutional: She is oriented to person, place, and time. She appears well-developed and well-nourished.  HENT:  Head: Normocephalic and atraumatic.  Eyes: Conjunctivae and EOM are normal. Pupils are equal, round, and reactive to light.  Neck: Carotid bruit is not present.  Cardiovascular: Normal rate, regular rhythm, normal heart sounds and intact distal pulses.   Pulmonary/Chest: Effort normal and breath sounds normal.  Abdominal: Soft. She exhibits no pulsatile midline mass. There is no tenderness.  Neurological: She is alert and oriented to person, place, and time.  Skin:  Skin is warm and dry.  Psychiatric: She has a normal mood and affect. Her behavior is normal.  Vitals reviewed.   Filed Vitals:   10/14/15 1145  BP: 131/83  Pulse: 69  Temp: 98.7 F (37.1 C)  Resp: 16  Height: 5' 8.5" (1.74 m)  Weight: 209 lb (94.802 kg)       Assessment & Plan:   Avey Mcmanamon is a 45 y.o. female Essential hypertension - Plan: Basic metabolic panel, amLODipine (NORVASC) 5 MG tablet, chlorthalidone (HYGROTON) 25 MG tablet  - Controlled. Will transition to calcium channel blocker off of beta blocker. Start Norvasc 5 mg daily, stop metoprolol. Continue chlorthalidone 12.5 mg daily. Monitor home blood pressures, and if over 140/90, can increase to 1 full dose of chlorthalidone at 25 mg daily. However hypotensive/orthostatic hypotensive precautions were discussed with the  medication. Recheck in 3-6 months here or with new primary care provider.   Anxiety state - Plan: ALPRAZolam (XANAX) 0.5 MG tablet Panic attacks - Plan: ALPRAZolam (XANAX) 0.5 MG tablet  - Intermittent. Stress and stress management discussed, also recommended counseling to help with coping techniques and stress management. Decided against SSRI at this time as infrequent dosing of Xanax, but if increased frequency, or persistent need for Xanax long-term, consider SSRI daily. Recheck 3-6 months.  Meds ordered this encounter  Medications  . metroNIDAZOLE (FLAGYL) 500 MG tablet    Sig:   . nystatin ointment (MYCOSTATIN)    Sig:   . terconazole (TERAZOL 3) 0.8 % vaginal cream    Sig:   . triamcinolone ointment (KENALOG) 0.1 %    Sig:   . amLODipine (NORVASC) 5 MG tablet    Sig: Take 1 tablet (5 mg total) by mouth daily.    Dispense:  90 tablet    Refill:  1  . ALPRAZolam (XANAX) 0.5 MG tablet    Sig: Take 1 tablet (0.5 mg total) by mouth 2 (two) times daily as needed for anxiety.    Dispense:  30 tablet    Refill:  0  . chlorthalidone (HYGROTON) 25 MG tablet    Sig: Take 0.5 tablets (12.5 mg  total) by mouth daily.    Dispense:  90 tablet    Refill:  1   Patient Instructions       IF you received an x-Hawkins today, you will receive an invoice from [facility]. Please contact [facility] at [phone] with questions or concerns regarding your invoice.   IF you received labwork today, you will receive an invoice from [employer]. Please contact [facility] at [phone] with questions or concerns regarding your invoice.   Our billing staff will not be able to assist you with questions regarding bills from these companies.  You will be contacted with the lab results as soon as they are available. The fastest way to get your results is to activate your My Chart account. Instructions are located on the last page of this paperwork. If you have not heard from [place] regarding the results in 2 weeks, please contact this office.    Change metoprolol to amlodipine 1 pill once per day. As with any change in blood pressure medication, make sure you're watching your readings at home and watch for any lightheadedness or dizziness that may be result of too low of blood pressure. If this were happens, let me know and we will adjust her medications. If blood pressure remains over 140/90 with this new medication, then increase the chlorthalidone to one full pill each day.  See information below on stress and stress management, and numbers for counselors. If you need other names for counselors, please let me know. For now can use the Xanax only as needed for panic or anxiety attacks, but if you do have to use this more often, would consider a daily medication. Follow-up with myself or other primary care provider within the next 6 months for recheck of blood pressure and your symptoms.  Vivia Budge: [phone] [place]: [phone] Criss Alvine: [phone]  Stress and Stress Management Stress is a normal reaction to life events. It is what you feel when life  demands more than you are used to or more than you can handle. Some stress can be useful. For example, the stress reaction can help you catch the last bus of the day, study for a  test, or meet a deadline at work. But stress that occurs too often or for too long can cause problems. It can affect your emotional health and interfere with relationships and normal daily activities. Too much stress can weaken your immune system and increase your risk for physical illness. If you already have a medical problem, stress can make it worse. CAUSES  All sorts of life events may cause stress. An event that causes stress for one person may not be stressful for another person. Major life events commonly cause stress. These may be positive or negative. Examples include losing your job, moving into a new home, getting married, having a baby, or losing a loved one. Less obvious life events may also cause stress, especially if they occur day after day or in combination. Examples include working long hours, driving in traffic, caring for children, being in debt, or being in a difficult relationship. SIGNS AND SYMPTOMS Stress may cause emotional symptoms including, the following:  Anxiety. This is feeling worried, afraid, on edge, overwhelmed, or out of control.  Anger. This is feeling irritated or impatient.  Depression. This is feeling sad, down, helpless, or guilty.  Difficulty focusing, remembering, or making decisions. Stress may cause physical symptoms, including the following:   Aches and pains. These may affect your head, neck, back, stomach, or other areas of your body.  Tight muscles or clenched jaw.  Low energy or trouble sleeping. Stress may cause unhealthy behaviors, including the following:   Eating to feel better (overeating) or skipping meals.  Sleeping too little, too much, or both.  Working too much or putting off tasks (procrastination).  Smoking, drinking alcohol, or using drugs to feel  better. DIAGNOSIS  Stress is diagnosed through an assessment by your health care provider. Your health care provider will ask questions about your symptoms and any stressful life events.Your health care provider will also ask about your medical history and may order blood tests or other tests. Certain medical conditions and medicine can cause physical symptoms similar to stress. Mental illness can cause emotional symptoms and unhealthy behaviors similar to stress. Your health care provider may refer you to a mental health professional for further evaluation.  TREATMENT  Stress management is the recommended treatment for stress.The goals of stress management are reducing stressful life events and coping with stress in healthy ways.  Techniques for reducing stressful life events include the following:  Stress identification. Self-monitor for stress and identify what causes stress for you. These skills may help you to avoid some stressful events.  Time management. Set your priorities, keep a calendar of events, and learn to say "no." These tools can help you avoid making too many commitments. Techniques for coping with stress include the following:  Rethinking the problem. Try to think realistically about stressful events rather than ignoring them or overreacting. Try to find the positives in a stressful situation rather than focusing on the negatives.  Exercise. Physical exercise can release both physical and emotional tension. The key is to find a form of exercise you enjoy and do it regularly.  Relaxation techniques. These relax the body and mind. Examples include yoga, meditation, tai chi, biofeedback, deep breathing, progressive muscle relaxation, listening to music, being out in nature, journaling, and other hobbies. Again, the key is to find one or more that you enjoy and can do regularly.  Healthy lifestyle. Eat a balanced diet, get plenty of sleep, and do not smoke. Avoid using alcohol or  drugs to relax.  Strong support network. Spend time with family, friends, or other people you enjoy being around.Express your feelings and talk things over with someone you trust. Counseling or talktherapy with a mental health professional may be helpful if you are having difficulty managing stress on your own. Medicine is typically not recommended for the treatment of stress.Talk to your health care provider if you think you need medicine for symptoms of stress. HOME CARE INSTRUCTIONS  Keep all follow-up visits as directed by your health care provider.  Take all medicines as directed by your health care provider. SEEK MEDICAL CARE IF:  Your symptoms get worse or you start having new symptoms.  You feel overwhelmed by your problems and can no longer manage them on your own. SEEK IMMEDIATE MEDICAL CARE IF:  You feel like hurting yourself or someone else.   This information is not intended to replace advice given to you by your health care provider. Make sure you discuss any questions you have with your health care provider.   Document Released: 12/26/2000 Document Revised: 07/23/2014 Document Reviewed: 02/24/2013 Elsevier Interactive Patient Education [employer].

## 2016-04-30 ENCOUNTER — Other Ambulatory Visit: Payer: Self-pay | Admitting: Family Medicine

## 2016-04-30 DIAGNOSIS — I10 Essential (primary) hypertension: Secondary | ICD-10-CM

## 2016-06-04 DIAGNOSIS — I10 Essential (primary) hypertension: Secondary | ICD-10-CM | POA: Insufficient documentation

## 2016-06-04 DIAGNOSIS — J019 Acute sinusitis, unspecified: Secondary | ICD-10-CM | POA: Insufficient documentation

## 2016-06-04 HISTORY — DX: Acute sinusitis, unspecified: J01.90

## 2016-06-04 HISTORY — DX: Essential (primary) hypertension: I10

## 2016-07-04 ENCOUNTER — Other Ambulatory Visit: Payer: Self-pay | Admitting: Family Medicine

## 2016-07-04 DIAGNOSIS — I10 Essential (primary) hypertension: Secondary | ICD-10-CM

## 2016-07-08 ENCOUNTER — Other Ambulatory Visit: Payer: Self-pay | Admitting: Family Medicine

## 2016-07-08 DIAGNOSIS — I10 Essential (primary) hypertension: Secondary | ICD-10-CM

## 2016-07-10 MED ORDER — AMLODIPINE BESYLATE 5 MG PO TABS: ORAL_TABLET | ORAL | 0 refills | Status: DC

## 2016-07-10 NOTE — Telephone Encounter (Signed)
Pt called and LM asking for 2 more mos of RFs and then she will set up appt to see Dr Carlota Raspberry. CB and LM for pt that she was due back to see him between June and Sept so she is already well past her f/up date. Advised I will give her 2 more week RF to give her time to come in and asked her to CB to sch appt. Left Dr Vonna Kotyk hours.

## 2016-07-10 NOTE — Telephone Encounter (Signed)
Pt called back and stated that she thinks that her ins has been billing her like we are urgent care for her and not primary, and therefore she has a balance with [place]. She asked that I send in 1 mos RF to allow her time to get her acct straight, and she will CB and set up appt as soon as they will allow it. Done.

## 2016-07-10 NOTE — Addendum Note (Signed)
Addended by: Elwyn Reach A on: 07/10/2016 04:43 PM   Modules accepted: Orders

## 2016-08-09 ENCOUNTER — Ambulatory Visit (INDEPENDENT_AMBULATORY_CARE_PROVIDER_SITE_OTHER): Payer: Commercial Managed Care - HMO | Admitting: Family Medicine

## 2016-08-09 ENCOUNTER — Encounter: Payer: Self-pay | Admitting: Family Medicine

## 2016-08-09 VITALS — BP 112/72 | HR 80 | Temp 98.1°F | Ht 68.5 in | Wt 219.4 lb

## 2016-08-09 DIAGNOSIS — F411 Generalized anxiety disorder: Secondary | ICD-10-CM

## 2016-08-09 DIAGNOSIS — Z0000 Encounter for general adult medical examination without abnormal findings: Secondary | ICD-10-CM | POA: Diagnosis not present

## 2016-08-09 DIAGNOSIS — R5383 Other fatigue: Secondary | ICD-10-CM | POA: Diagnosis not present

## 2016-08-09 DIAGNOSIS — I10 Essential (primary) hypertension: Secondary | ICD-10-CM

## 2016-08-09 DIAGNOSIS — Z23 Encounter for immunization: Secondary | ICD-10-CM

## 2016-08-09 DIAGNOSIS — F410 Panic disorder [episodic paroxysmal anxiety] without agoraphobia: Secondary | ICD-10-CM | POA: Diagnosis not present

## 2016-08-09 DIAGNOSIS — M7520 Bicipital tendinitis, unspecified shoulder: Secondary | ICD-10-CM

## 2016-08-09 DIAGNOSIS — R311 Benign essential microscopic hematuria: Secondary | ICD-10-CM | POA: Diagnosis not present

## 2016-08-09 MED ORDER — ALPRAZOLAM 0.5 MG PO TABS: 0.5000 mg | ORAL_TABLET | Freq: Two times a day (BID) | ORAL | 0 refills | Status: DC | PRN

## 2016-08-09 MED ORDER — AMLODIPINE BESYLATE 5 MG PO TABS: ORAL_TABLET | ORAL | 1 refills | Status: DC

## 2016-08-09 MED ORDER — CHLORTHALIDONE 25 MG PO TABS: 12.5000 mg | ORAL_TABLET | Freq: Every day | ORAL | 1 refills | Status: DC

## 2016-08-09 NOTE — Progress Notes (Signed)
By signing my name below, I, Danielle Hawkins, attest that this documentation has been prepared under the direction and in the presence of Danielle Ray, MD.  Electronically Signed: Verlee Hawkins, Medical Scribe. 08/09/16. 2:08 PM.  Subjective:    Patient ID: Danielle Hawkins, female    DOB: [birth year], 46 y.o.   MRN: [id number]  HPI Chief Complaint  Patient presents with  . Annual Exam    HPI Comments: Danielle Hawkins is a 46 y.o. female with a PMHx of anxiety/panic attacks who presents to the [facility] for a physical. Last visit with me was March 2017 for HTN. For anxiety, we discussed stress and stress management and phone numbers of counseling were provided. Decided against SSRI at that time; xanax was only as needed for panic attacks.  HTN: Takes amlodipine 5 mg QD, and hygroton 12.5 mg QD. Mentions her bp has been fine. Denies experiencing negative side effects from her medication.  Lab Results  Component Value Date   CREATININE 0.92 10/14/2015   Cancer Screening: Breast CA: Started getting mammograms at 46 y/o. She was told she had dense fiber in her breast that checkout fine. Last mammorgram 1.5 years ago, with an upcoming apt with OB/GYN Dr. Willis Modena this year. FHx: maternal aunts (2), maternal great aunt with breast cancer. Cervical CA: Last pap smear was at her OB/GYN less than a year ago; upcoming appt in March.  Immunizations: Pt has not had her flu shot this year and declined her flu shot today. Pt does not have her tetanus shot and would like to get it today.  There is no immunization history on file for this patient.  Vision: She last saw her ophthalmologist with full exam mid 2017. She works for an [employer], retail.  Visual Acuity Screening   Right eye Left eye Both eyes  Without correction: 20/20 20/25 20/20   With correction: 20/15 20/15 20/15    Dentist: Pt hasn't seen a dentist in a little over a year, but plans on setting up an appt  this year.  Exercise: Pt does not exercise.  Depression Screening: Pt takes xanax once a month or up to once a week and she rarely uses it - she still has some left over but she's running out. Pt's anxiety has been the same, and it "seems like she can control it". Pt has not talked to anyone about her anxiety because it causes stress to mention it.  Depression screen [facility] 2/9 08/09/2016 10/14/2015 01/06/2015  Decreased Interest 0 0 0  Down, Depressed, Hopeless 0 0 0  PHQ - 2 Score 0 0 0   STI Testing: negative 2015 for syphilis, gonorrhea, chlamydia, HIV, Hep C, and trichomoniasis. Negative Hep B surface antigen. Pt doesn't think there is any reason to get STI testing today. Denies new partners or unprotected intercourse.   Reports having a hx of hematuria: When she got it checked by urology, they couldn't find anything that was causing it. She still has a kidney stone, but they didn't tell her to come in intermitently. No known follow up.  Pt notices her urine is darker than it should be only at times.  Denies visualizing blood in her urine. No new urinary symptoms.   Anemia: Dx at the gynecologist- gynecology also checks her thyroid. Pt suspects her fatigue is from low iron. Reports she has a hx of low iron. Pt does not take iron supplements. OB/GYN told her to take prenatal vitamins but she hasn't since she has  a hard time swallowing things.  Left Shoulder Pain: Occured after jerking her arm while lifting a heavy suitcase over a month ago. Pain is located around her front shoulder region. No worsening, some soreness still present.   Cold/Flu: Mentions she's getting over a cold/the flu she had 6 days ago and she's feeling better. She had sick contacts from her mom and nephew who were sick with the flu.  There are no active problems to display for this patient.  Past Medical History:  Diagnosis Date  . Allergy   . Anemia   . Anxiety   . Blood transfusion without reported diagnosis   .  Encounter for blood transfusion   . Fibroid   . 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 Hypertension    Past Surgical History:  Procedure Laterality Date  . GYNECOLOGIC CRYOSURGERY  early 20's  . keloid removal    . LITHOTRIPSY     Allergies  Allergen Reactions  . Diflucan [Fluconazole]   . Niacin And Related   . Other Itching    IV dye  . Percocet [Oxycodone-Acetaminophen]     Itching and insomnia  . Red Dye   . Tramadol Itching   Prior to Admission medications   Medication Sig Start Date End Date Taking? Authorizing Provider  ALPRAZolam Duanne Moron) 0.5 MG tablet Take 1 tablet (0.5 mg total) by mouth 2 (two) times daily as needed for anxiety. 10/14/15   Wendie Agreste, MD  amLODipine (NORVASC) 5 MG tablet TAKE ONE TABLET BY MOUTH ONCE DAILY. 07/10/16   Wendie Agreste, MD  chlorthalidone (HYGROTON) 25 MG tablet Take 0.5 tablets (12.5 mg total) by mouth daily. 10/14/15   Wendie Agreste, MD  metroNIDAZOLE (FLAGYL) 500 MG tablet  10/14/15   Historical Provider, MD  norgestimate-ethinyl estradiol (ORTHO-CYCLEN,SPRINTEC,PREVIFEM) 0.25-35 MG-MCG tablet Take 1 tablet by mouth daily.    Historical Provider, MD  nystatin ointment (MYCOSTATIN)  09/02/15   Historical Provider, MD  terconazole (TERAZOL 3) 0.8 % vaginal cream  10/14/15   Historical Provider, MD  triamcinolone ointment (KENALOG) 0.1 %  09/02/15   Historical Provider, MD   Social History   Social History  . Marital status: Single    Spouse name: N/A  . Number of children: N/A  . Years of education: N/A   Occupational History  . Not on file.   Social History Main Topics  . Smoking status: Former Research scientist (life sciences)  . Smokeless tobacco: Never Used  . Alcohol use 0.6 oz/week    1 Glasses of wine per week     Comment: Rare  . Drug use: No  . Sexual activity: Yes    Birth control/ protection: Pill   Other Topics Concern  . Not on file   Social History Narrative  . No narrative on file   Review of Systems   Constitutional: Positive for fatigue.  HENT: Positive for nosebleeds, rhinorrhea and sinus pressure.   Genitourinary: Positive for hematuria.  Musculoskeletal: Positive for back pain.  13 point ROS was positive for the above. Objective:  Physical Exam  Constitutional: She is oriented to person, place, and time. She appears well-developed and well-nourished.  HENT:  Head: Normocephalic and atraumatic.  Right Ear: External ear normal.  Left Ear: External ear normal.  Mouth/Throat: Oropharynx is clear and moist.  Eyes: Conjunctivae are normal. Pupils are equal, round, and reactive to light.  Neck: Normal range of motion. Neck supple. No thyromegaly present.  Cardiovascular: Normal rate,  regular rhythm, normal heart sounds and intact distal pulses.   No murmur heard. Pulmonary/Chest: Effort normal and breath sounds normal. No respiratory distress. She has no wheezes.  Abdominal: Soft. Bowel sounds are normal. There is no tenderness.  Musculoskeletal: Normal range of motion. She exhibits no edema or tenderness.  Left shoulder FROM [place]/AC clavicle nontender Bicep strength intact but she is tender around the bicipital groove Full rotator cuff strength  Lymphadenopathy:    She has no cervical adenopathy.  Neurological: She is alert and oriented to person, place, and time.  Skin: Skin is warm and dry. No rash noted.  Psychiatric: She has a normal mood and affect. Her behavior is normal. Thought content normal.   BP 112/72 (BP Location: Right Arm, Patient Position: Sitting, Cuff Size: Small)   Pulse 80   Temp 98.1 F (36.7 C) (Oral)   Ht 5' 8.5" (1.74 m)   Wt 219 lb 6.4 oz (99.5 kg)   LMP 08/07/2016 (Exact Date)   SpO2 97%   BMI 32.87 kg/m  Assessment & Plan:    Micheala Makarewicz is a 46 y.o. female Annual physical exam  - -anticipatory guidance as below in AVS, screening labs above. Health maintenance items as above in HPI discussed/recommended as applicable.   Need for Tdap  vaccination - Plan: Tdap vaccine greater than or equal to 7yo IM given  Benign essential microscopic hematuria  -Negative workup by report previously. Denies any acute symptoms, but RTC precautions given if pain dysuria or gross hematuria.  Essential hypertension - Plan: Comprehensive metabolic panel, Lipid panel, chlorthalidone (HYGROTON) 25 MG tablet, amLODipine (NORVASC) 5 MG tablet  - Stable. Tolerating current meds, no changes. Labs pending  Fatigue, unspecified type - Plan: CBC with Differential/Platelet, Fe+TIBC+Fer, TSH  -Possible iron deficiency anemia by history, check CBC, TSH and follow-up to discuss fatigue further if cause not found on lab work.  Anxiety state - Plan: ALPRAZolam (XANAX) 0.5 MG tablet Panic attacks - Plan: ALPRAZolam (XANAX) 0.5 MG tablet  -Overall stable with intermittent use of Xanax. Counseling numbers were provided.  Biceps Tendonitis.   -Possible anterior shoulder strain versus biceps tendinitis based on location of discomfort. Also noted some back pain during visit.   -Tylenol, general range of motion, avoidance of offending activities for shoulder, and recheck in the next few weeks if persistent for possible x-rays/other evaluation.  Meds ordered this encounter  Medications  . ALPRAZolam (XANAX) 0.5 MG tablet    Sig: Take 1 tablet (0.5 mg total) by mouth 2 (two) times daily as needed for anxiety.    Dispense:  30 tablet    Refill:  0  . chlorthalidone (HYGROTON) 25 MG tablet    Sig: Take 0.5 tablets (12.5 mg total) by mouth daily.    Dispense:  90 tablet    Refill:  1  . amLODipine (NORVASC) 5 MG tablet    Sig: TAKE ONE TABLET BY MOUTH ONCE DAILY.    Dispense:  90 tablet    Refill:  1   Patient Instructions    For anxiety - ok to take xanax intermittently, but I would like you to meet with a therapist to discuss those symptoms further. You can ask if their office is open saturdays or if they know of a provider that is open on Saturdays.    Vivia Budge: [id number] Arvil [id number] [phone]  Your shoulder pain may be due to biceps tendinitis. Avoid overuse/repetitive lifting of that arm in the next 2 weeks, recheck at that  time if not improving. Tylenol   I will check a thyroid tests as well as your iron level and blood counts for the fatigue, but if those do not indicate a cause, return to discuss fatigue further.  If back pain persists, return to discuss that as well as imaging/other treatments may be needed.   Return to the clinic or go to the nearest emergency room if any of your symptoms worsen or new symptoms occur.  Keeping You Healthy  Get These Tests 1. Blood Pressure- Have your blood pressure checked once a year by your health care provider.  Normal blood pressure is 120/80. 2. Weight- Have your body mass index (BMI) calculated to screen for obesity.  BMI is measure of body fat based on height and weight.  You can also calculate your own BMI at GravelBags.it. 3. Cholesterol- Have your cholesterol checked every 5 years starting at age 43 then yearly starting at age 77. 28. Chlamydia, HIV, and other sexually transmitted diseases- Get screened every year until age 38, then within three months of each new sexual provider. 5. Pap Test - Every 1-5 years; discuss with your health care provider. 6. Mammogram- Every 1-2 years starting at age 34--50  Take these medicines  Calcium with Vitamin D-Your body needs 1200 mg of Calcium each day and [phone] IU of Vitamin D daily.  Your body can only absorb 500 mg of Calcium at a time so Calcium must be taken in 2 or 3 divided doses throughout the day.  Multivitamin with folic acid- Once daily if it is possible for you to become pregnant.  Get these Immunizations  Gardasil-Series of three doses; prevents HPV related illness such as genital warts and cervical cancer.  Menactra-Single dose; prevents meningitis.  Tetanus shot- Every 10 years.  Flu shot-Every  year.  Take these steps 1. Do not smoke-Your healthcare provider can help you quit.  For tips on how to quit go to www.smokefree.gov or call 1-800 QUITNOW. 2. Be physically active- Exercise 5 days a week for at least 30 minutes.  If you are not already physically active, start slow and gradually work up to 30 minutes of moderate physical activity.  Examples of moderate activity include walking briskly, dancing, swimming, bicycling, etc. 3. Breast Cancer- A self breast exam every month is important for early detection of breast cancer.  For more information and instruction on self breast exams, ask your healthcare provider or https://www.patel.info/. 4. Eat a healthy diet- Eat a variety of healthy foods such as fruits, vegetables, whole grains, low fat milk, low fat cheeses, yogurt, lean meats, poultry and fish, beans, nuts, tofu, etc.  For more information go to www. Thenutritionsource.org 5. Drink alcohol in moderation- Limit alcohol intake to one drink or less per day. Never drink and drive. 6. Depression- Your emotional health is as important as your physical health.  If you're feeling down or losing interest in things you normally enjoy please talk to your healthcare provider about being screened for depression. 7. Dental visit- Brush and floss your teeth twice daily; visit your dentist twice a year. 8. Eye doctor- Get an eye exam at least every 2 years. 9. Helmet use- Always wear a helmet when riding a bicycle, motorcycle, rollerblading or skateboarding. 34. Safe sex- If you may be exposed to sexually transmitted infections, use a condom. 11. Seat belts- Seat belts can save your live; always wear one. 12. Smoke/Carbon Monoxide detectors- These detectors need to be installed on the appropriate level of  your home. Replace batteries at least once a year. 13. Skin cancer- When out in the sun please cover up and use sunscreen 15 SPF or higher. 14. Violence- If anyone is  threatening or hurting you, please tell your healthcare provider.          Biceps Tendon Tendinitis (Proximal) and Tenosynovitis The proximal biceps tendon is a strong cord of tissue that connects the biceps muscle, on the front of the upper arm, to the shoulder blade. Tendinitis is inflammation of a tendon. Tenosynovitis is inflammation of the lining around the tendon (tendon sheath). These conditions often occur at the same time, and they can interfere with the ability to bend the elbow and turn the hand palm-up (supination). Proximal biceps tendon tendinitis and tenosynovitis are usually caused by overusing the shoulder joint and the biceps muscle. These conditions usually heal within 6 weeks. Proximal biceps tendon tendinitis may include a grade 1 or grade 2 strain of the tendon. A grade 1 strain is mild, and it involves a slight pull of the tendon without any stretching or noticeable tearing of the tendon. There is usually no loss of biceps muscle strength. A grade 2 strain is moderate, and it involves a small tear in the tendon. The tendon is stretched, and biceps strength is usually decreased. What are the causes? This condition may be caused by:  A sudden increase in frequency or intensity of activity that involves the shoulder and the biceps muscle.  Overuse of the biceps muscle. This can happen when you do the same movements over and over, such as:  Supination.  Forceful straightening (hyperextension) of the elbow.  Bending the elbow.  A direct, forceful hit or injury (trauma) to the elbow. This is rare. What increases the risk? The following factors may make you more likely to develop this condition:  Playing contact sports.  Playing sports that involve throwing and overhead movements, including racket sports, gymnastics, weight lifting, or bodybuilding.  Doing physical labor.  Having poor strength and flexibility of the arm and shoulder. What are the signs or  symptoms? Symptoms of this condition may include:  Pain and inflammation in the front of the shoulder. Pain may get worse with movement, especially when you use resistance, as in weight lifting.  A feeling of warmth in the front of the shoulder.  Limited range of motion of the shoulder and the elbow.  A crackling sound (crepitation) when you move or touch the shoulder or the upper arm. In some cases, symptoms may return (recur) after treatment, and they may be long-lasting (chronic). How is this diagnosed? This condition is diagnosed based on your symptoms, your medical history, and a physical exam. You may have tests, including X-rays or MRIs. Your health care provider may test your range of motion by asking you to do arm movements. How is this treated? This condition is treated by resting and icing the injured area, and by doing physical therapy exercises. Depending on the severity of your condition, treatment may also include:  Medicines to help relieve pain and inflammation.  Ultrasound therapy. This is the application of sound waves to the injured area.  Injecting medicines (corticosteroids) into your tendon sheath.  Injecting medicines that numb the area (local anesthetics).  Surgery to remove the damaged part of the tendon and reattach the undamaged part of the tendon to the arm bone (humerus). This is usually only done if you have symptoms that do not get better with other treatment methods. Follow these  instructions at home: Managing pain, stiffness, and swelling  If directed, put ice on the injured area:  Put ice in a plastic bag.  Place a towel between your skin and the bag.  Leave the ice on for 20 minutes, 2-3 times a day.  Move your fingers often to avoid stiffness and to lessen swelling.  Raise (elevate) the injured area above the level of your heart while you are sitting or lying down.  If directed, apply heat to the affected area before you exercise. Use the  heat source that your health care provider recommends, such as a moist heat pack or a heating pad.  Place a towel between your skin and the heat source.  Leave the heat on for 20-30 minutes.  Remove the heat if your skin turns bright red. This is especially important if you are unable to feel pain, heat, or cold. You may have a greater risk of getting burned. Activity  Return to your normal activities as told by your health care provider. Ask your health care provider what activities are safe for you.  Do not lift anything that is heavier than 10 lb (4.5 kg) until your health care provider tells you that it is safe.  Avoid activities that cause pain or make your condition worse.  Do exercises as told by your health care provider. General instructions  Take over-the-counter and prescription medicines only as told by your health care provider.  Do not drive or operate heavy machinery while taking prescription pain medicines.  Keep all follow-up visits as told by your health care provider. This is important. How is this prevented?  Warm up and stretch before being active.  Cool down and stretch after being active.  Give your body time to rest between periods of activity.  Make sure any equipment that you use is fitted to you.  Be safe and responsible while being active to avoid falls.  Do at least 150 minutes of moderate-intensity aerobic exercise each week, such as brisk walking or water aerobics.  Maintain physical fitness, including:  Strength.  Flexibility.  Cardiovascular fitness.  Endurance. Contact a health care provider if:  You have symptoms that get worse or do not get better after 2 weeks of treatment.  You develop new symptoms. Get help right away if:  You develop severe pain. This information is not intended to replace advice given to you by your health care provider. Make sure you discuss any questions you have with your health care provider. Document  Released: 07/02/2005 Document Revised: 03/08/2016 Document Reviewed: 06/10/2015 Elsevier Interactive Patient Education  2017 [employer].   IF you received an x-Hawkins today, you will receive an invoice from [facility]. Please contact [facility] at [phone] with questions or concerns regarding your invoice.   IF you received labwork today, you will receive an invoice from [place]. Please contact [facility] at [phone] with questions or concerns regarding your invoice.   Our billing staff will not be able to assist you with questions regarding bills from these companies.  You will be contacted with the lab results as soon as they are available. The fastest way to get your results is to activate your My Chart account. Instructions are located on the last page of this paperwork. If you have not heard from [place] regarding the results in 2 weeks, please contact this office.       I personally performed the services described in this documentation, which was scribed in my presence. The  recorded information has been reviewed and considered, and addended by me as needed.   Signed,   Danielle Ray, MD [facility].  08/12/16 1:29 PM

## 2016-08-09 NOTE — Patient Instructions (Addendum)
For anxiety - ok to take xanax intermittently, but I would like you to meet with a therapist to discuss those symptoms further. You can ask if their office is open saturdays or if they know of a provider that is open on Saturdays.   Danielle Hawkins: [id number] Arvil [id number] [phone]  Your shoulder pain may be due to biceps tendinitis. Avoid overuse/repetitive lifting of that arm in the next 2 weeks, recheck at that time if not improving. Tylenol   I will check a thyroid tests as well as your iron level and blood counts for the fatigue, but if those do not indicate a cause, return to discuss fatigue further.  If back pain persists, return to discuss that as well as imaging/other treatments may be needed.   Return to the clinic or go to the nearest emergency room if any of your symptoms worsen or new symptoms occur.  Keeping You Healthy  Get These Tests 1. Blood Pressure- Have your blood pressure checked once a year by your health care provider.  Normal blood pressure is 120/80. 2. Weight- Have your body mass index (BMI) calculated to screen for obesity.  BMI is measure of body fat based on height and weight.  You can also calculate your own BMI at GravelBags.it. 3. Cholesterol- Have your cholesterol checked every 5 years starting at age 1 then yearly starting at age 53. 25. Chlamydia, HIV, and other sexually transmitted diseases- Get screened every year until age 13, then within three months of each new sexual provider. 5. Pap Test - Every 1-5 years; discuss with your health care provider. 6. Mammogram- Every 1-2 years starting at age 73--50  Take these medicines  Calcium with Vitamin D-Your body needs 1200 mg of Calcium each day and [phone] IU of Vitamin D daily.  Your body can only absorb 500 mg of Calcium at a time so Calcium must be taken in 2 or 3 divided doses throughout the day.  Multivitamin with folic acid- Once daily if it is possible for you to become  pregnant.  Get these Immunizations  Gardasil-Series of three doses; prevents HPV related illness such as genital warts and cervical cancer.  Menactra-Single dose; prevents meningitis.  Tetanus shot- Every 10 years.  Flu shot-Every year.  Take these steps 1. Do not smoke-Your healthcare provider can help you quit.  For tips on how to quit go to www.smokefree.gov or call 1-800 QUITNOW. 2. Be physically active- Exercise 5 days a week for at least 30 minutes.  If you are not already physically active, start slow and gradually work up to 30 minutes of moderate physical activity.  Examples of moderate activity include walking briskly, dancing, swimming, bicycling, etc. 3. Breast Cancer- A self breast exam every month is important for early detection of breast cancer.  For more information and instruction on self breast exams, ask your healthcare provider or https://www.patel.info/. 4. Eat a healthy diet- Eat a variety of healthy foods such as fruits, vegetables, whole grains, low fat milk, low fat cheeses, yogurt, lean meats, poultry and fish, beans, nuts, tofu, etc.  For more information go to www. Thenutritionsource.org 5. Drink alcohol in moderation- Limit alcohol intake to one drink or less per day. Never drink and drive. 6. Depression- Your emotional health is as important as your physical health.  If you're feeling down or losing interest in things you normally enjoy please talk to your healthcare provider about being screened for depression. 7. Dental visit- Brush and floss your teeth twice  daily; visit your dentist twice a year. 8. Eye doctor- Get an eye exam at least every 2 years. 9. Helmet use- Always wear a helmet when riding a bicycle, motorcycle, rollerblading or skateboarding. 11. Safe sex- If you may be exposed to sexually transmitted infections, use a condom. 11. Seat belts- Seat belts can save your live; always wear one. 12. Smoke/Carbon Monoxide detectors-  These detectors need to be installed on the appropriate level of your home. Replace batteries at least once a year. 13. Skin cancer- When out in the sun please cover up and use sunscreen 15 SPF or higher. 14. Violence- If anyone is threatening or hurting you, please tell your healthcare provider.          Biceps Tendon Tendinitis (Proximal) and Tenosynovitis The proximal biceps tendon is a strong cord of tissue that connects the biceps muscle, on the front of the upper arm, to the shoulder blade. Tendinitis is inflammation of a tendon. Tenosynovitis is inflammation of the lining around the tendon (tendon sheath). These conditions often occur at the same time, and they can interfere with the ability to bend the elbow and turn the hand palm-up (supination). Proximal biceps tendon tendinitis and tenosynovitis are usually caused by overusing the shoulder joint and the biceps muscle. These conditions usually heal within 6 weeks. Proximal biceps tendon tendinitis may include a grade 1 or grade 2 strain of the tendon. A grade 1 strain is mild, and it involves a slight pull of the tendon without any stretching or noticeable tearing of the tendon. There is usually no loss of biceps muscle strength. A grade 2 strain is moderate, and it involves a small tear in the tendon. The tendon is stretched, and biceps strength is usually decreased. What are the causes? This condition may be caused by:  A sudden increase in frequency or intensity of activity that involves the shoulder and the biceps muscle.  Overuse of the biceps muscle. This can happen when you do the same movements over and over, such as:  Supination.  Forceful straightening (hyperextension) of the elbow.  Bending the elbow.  A direct, forceful hit or injury (trauma) to the elbow. This is rare. What increases the risk? The following factors may make you more likely to develop this condition:  Playing contact sports.  Playing sports  that involve throwing and overhead movements, including racket sports, gymnastics, weight lifting, or bodybuilding.  Doing physical labor.  Having poor strength and flexibility of the arm and shoulder. What are the signs or symptoms? Symptoms of this condition may include:  Pain and inflammation in the front of the shoulder. Pain may get worse with movement, especially when you use resistance, as in weight lifting.  A feeling of warmth in the front of the shoulder.  Limited range of motion of the shoulder and the elbow.  A crackling sound (crepitation) when you move or touch the shoulder or the upper arm. In some cases, symptoms may return (recur) after treatment, and they may be long-lasting (chronic). How is this diagnosed? This condition is diagnosed based on your symptoms, your medical history, and a physical exam. You may have tests, including X-rays or MRIs. Your health care provider may test your range of motion by asking you to do arm movements. How is this treated? This condition is treated by resting and icing the injured area, and by doing physical therapy exercises. Depending on the severity of your condition, treatment may also include:  Medicines to help relieve pain  and inflammation.  Ultrasound therapy. This is the application of sound waves to the injured area.  Injecting medicines (corticosteroids) into your tendon sheath.  Injecting medicines that numb the area (local anesthetics).  Surgery to remove the damaged part of the tendon and reattach the undamaged part of the tendon to the arm bone (humerus). This is usually only done if you have symptoms that do not get better with other treatment methods. Follow these instructions at home: Managing pain, stiffness, and swelling  If directed, put ice on the injured area:  Put ice in a plastic bag.  Place a towel between your skin and the bag.  Leave the ice on for 20 minutes, 2-3 times a day.  Move your fingers  often to avoid stiffness and to lessen swelling.  Raise (elevate) the injured area above the level of your heart while you are sitting or lying down.  If directed, apply heat to the affected area before you exercise. Use the heat source that your health care provider recommends, such as a moist heat pack or a heating pad.  Place a towel between your skin and the heat source.  Leave the heat on for 20-30 minutes.  Remove the heat if your skin turns bright red. This is especially important if you are unable to feel pain, heat, or cold. You may have a greater risk of getting burned. Activity  Return to your normal activities as told by your health care provider. Ask your health care provider what activities are safe for you.  Do not lift anything that is heavier than 10 lb (4.5 kg) until your health care provider tells you that it is safe.  Avoid activities that cause pain or make your condition worse.  Do exercises as told by your health care provider. General instructions  Take over-the-counter and prescription medicines only as told by your health care provider.  Do not drive or operate heavy machinery while taking prescription pain medicines.  Keep all follow-up visits as told by your health care provider. This is important. How is this prevented?  Warm up and stretch before being active.  Cool down and stretch after being active.  Give your body time to rest between periods of activity.  Make sure any equipment that you use is fitted to you.  Be safe and responsible while being active to avoid falls.  Do at least 150 minutes of moderate-intensity aerobic exercise each week, such as brisk walking or water aerobics.  Maintain physical fitness, including:  Strength.  Flexibility.  Cardiovascular fitness.  Endurance. Contact a health care provider if:  You have symptoms that get worse or do not get better after 2 weeks of treatment.  You develop new symptoms. Get  help right away if:  You develop severe pain. This information is not intended to replace advice given to you by your health care provider. Make sure you discuss any questions you have with your health care provider. Document Released: 07/02/2005 Document Revised: 03/08/2016 Document Reviewed: 06/10/2015 Elsevier Interactive Patient Education  2017 [employer].   IF you received an x-ray today, you will receive an invoice from [facility]. Please contact [facility] at [phone] with questions or concerns regarding your invoice.   IF you received labwork today, you will receive an invoice from [facility]. Please contact [facility] at [phone] with questions or concerns regarding your invoice.   Our billing staff will not be able to assist you with questions regarding bills from these companies.  You  will be contacted with the lab results as soon as they are available. The fastest way to get your results is to activate your My Chart account. Instructions are located on the last page of this paperwork. If you have not heard from [place] regarding the results in 2 weeks, please contact this office.

## 2016-08-10 LAB — LIPID PANEL
CHOL/HDL RATIO: 3 ratio (ref 0.0–4.4)
Cholesterol, Total: 151 mg/dL (ref 100–199)
HDL: 50 mg/dL (ref >39)
LDL Calculated: 80 mg/dL (ref 0–99)
Triglycerides: 105 mg/dL (ref 0–149)
VLDL Cholesterol Cal: 21 mg/dL (ref 5–40)

## 2016-08-10 LAB — COMPREHENSIVE METABOLIC PANEL
A/G RATIO: 1.5 (ref 1.2–2.2)
ALT: 16 IU/L (ref 0–32)
AST: 13 IU/L (ref 0–40)
Albumin: 4 g/dL (ref 3.5–5.5)
Alkaline Phosphatase: 53 IU/L (ref 39–117)
BILIRUBIN TOTAL: 0.3 mg/dL (ref 0.0–1.2)
BUN/Creatinine Ratio: 13 (ref 9–23)
BUN: 11 mg/dL (ref 6–24)
CALCIUM: 8.7 mg/dL (ref 8.7–10.2)
CHLORIDE: 100 mmol/L (ref 96–106)
CO2: 25 mmol/L (ref 18–29)
Creatinine, Ser: 0.88 mg/dL (ref 0.57–1.00)
GFR, EST AFRICAN AMERICAN: 92 mL/min/{1.73_m2} (ref >59)
GFR, EST NON AFRICAN AMERICAN: 80 mL/min/{1.73_m2} (ref >59)
GLOBULIN, TOTAL: 2.6 g/dL (ref 1.5–4.5)
Glucose: 63 mg/dL — ABNORMAL LOW (ref 65–99)
POTASSIUM: 3.8 mmol/L (ref 3.5–5.2)
SODIUM: 143 mmol/L (ref 134–144)
TOTAL PROTEIN: 6.6 g/dL (ref 6.0–8.5)

## 2016-08-10 LAB — CBC WITH DIFFERENTIAL/PLATELET
BASOS ABS: 0 10*3/uL (ref 0.0–0.2)
Basos: 0 %
EOS (ABSOLUTE): 0.1 10*3/uL (ref 0.0–0.4)
EOS: 2 %
HEMATOCRIT: 36.3 % (ref 34.0–46.6)
HEMOGLOBIN: 11.8 g/dL (ref 11.1–15.9)
IMMATURE GRANS (ABS): 0 10*3/uL (ref 0.0–0.1)
Immature Granulocytes: 0 %
LYMPHS: 31 %
Lymphocytes Absolute: 1.3 10*3/uL (ref 0.7–3.1)
MCH: 26.2 pg — ABNORMAL LOW (ref 26.6–33.0)
MCHC: 32.5 g/dL (ref 31.5–35.7)
MCV: 81 fL (ref 79–97)
MONOCYTES: 9 %
Monocytes Absolute: 0.4 10*3/uL (ref 0.1–0.9)
Neutrophils Absolute: 2.5 10*3/uL (ref 1.4–7.0)
Neutrophils: 58 %
Platelets: 352 10*3/uL (ref 150–379)
RBC: 4.51 x10E6/uL (ref 3.77–5.28)
RDW: 14.4 % (ref 12.3–15.4)
WBC: 4.3 10*3/uL (ref 3.4–10.8)

## 2016-08-10 LAB — TSH: TSH: 0.8 u[IU]/mL (ref 0.450–4.500)

## 2016-08-10 LAB — FE+TIBC+FER
FERRITIN: 32 ng/mL (ref 15–150)
IRON SATURATION: 19 % (ref 15–55)
IRON: 76 ug/dL (ref 27–159)
TIBC: 408 ug/dL (ref 250–450)
UIBC: 332 ug/dL (ref 131–425)

## 2016-11-05 ENCOUNTER — Emergency Department (HOSPITAL_COMMUNITY)
Admission: EM | Admit: 2016-11-05 | Discharge: 2016-11-05 | Disposition: A | Discharge status: Home / Self Care | Payer: Commercial Managed Care - HMO | Attending: Emergency Medicine | Admitting: Emergency Medicine

## 2016-11-05 ENCOUNTER — Emergency Department (HOSPITAL_COMMUNITY): Payer: Commercial Managed Care - HMO

## 2016-11-05 ENCOUNTER — Encounter (HOSPITAL_COMMUNITY): Payer: Self-pay | Admitting: *Deleted

## 2016-11-05 DIAGNOSIS — Z79899 Other long term (current) drug therapy: Secondary | ICD-10-CM | POA: Insufficient documentation

## 2016-11-05 DIAGNOSIS — E876 Hypokalemia: Secondary | ICD-10-CM | POA: Diagnosis not present

## 2016-11-05 DIAGNOSIS — I10 Essential (primary) hypertension: Secondary | ICD-10-CM | POA: Insufficient documentation

## 2016-11-05 DIAGNOSIS — G44209 Tension-type headache, unspecified, not intractable: Secondary | ICD-10-CM | POA: Diagnosis not present

## 2016-11-05 DIAGNOSIS — Z87891 Personal history of nicotine dependence: Secondary | ICD-10-CM | POA: Insufficient documentation

## 2016-11-05 DIAGNOSIS — R51 Headache: Secondary | ICD-10-CM | POA: Diagnosis present

## 2016-11-05 HISTORY — DX: Disorder of kidney and ureter, unspecified: N28.9

## 2016-11-05 LAB — CBC WITH DIFFERENTIAL/PLATELET
BASOS ABS: 0 10*3/uL (ref 0.0–0.1)
Basophils Relative: 0 %
EOS ABS: 0.5 10*3/uL (ref 0.0–0.7)
EOS PCT: 9 %
HCT: 34.6 % — ABNORMAL LOW (ref 36.0–46.0)
Hemoglobin: 11.4 g/dL — ABNORMAL LOW (ref 12.0–15.0)
LYMPHS ABS: 1.2 10*3/uL (ref 0.7–4.0)
Lymphocytes Relative: 23 %
MCH: 26.7 pg (ref 26.0–34.0)
MCHC: 32.9 g/dL (ref 30.0–36.0)
MCV: 81 fL (ref 78.0–100.0)
Monocytes Absolute: 0.3 10*3/uL (ref 0.1–1.0)
Monocytes Relative: 5 %
Neutro Abs: 3.3 10*3/uL (ref 1.7–7.7)
Neutrophils Relative %: 63 %
PLATELETS: 340 10*3/uL (ref 150–400)
RBC: 4.27 MIL/uL (ref 3.87–5.11)
RDW: 13.4 % (ref 11.5–15.5)
WBC: 5.4 10*3/uL (ref 4.0–10.5)

## 2016-11-05 LAB — BASIC METABOLIC PANEL
ANION GAP: 10 (ref 5–15)
BUN: 9 mg/dL (ref 6–20)
CALCIUM: 8.7 mg/dL — AB (ref 8.9–10.3)
CO2: 25 mmol/L (ref 22–32)
Chloride: 104 mmol/L (ref 101–111)
Creatinine, Ser: 0.99 mg/dL (ref 0.44–1.00)
Glucose, Bld: 78 mg/dL (ref 65–99)
POTASSIUM: 2.8 mmol/L — AB (ref 3.5–5.1)
Sodium: 139 mmol/L (ref 135–145)

## 2016-11-05 LAB — I-STAT BETA HCG BLOOD, ED (MC, WL, AP ONLY): I-stat hCG, quantitative: <5 m[IU]/mL (ref <5)

## 2016-11-05 MED ORDER — KETOROLAC TROMETHAMINE 30 MG/ML IJ SOLN
30.0000 mg | Freq: Once | INTRAMUSCULAR | Status: AC
  Administered 2016-11-05: 30 mg via INTRAVENOUS
  Filled 2016-11-05: qty 1

## 2016-11-05 MED ORDER — NAPROXEN 500 MG PO TABS: 500.0000 mg | ORAL_TABLET | Freq: Two times a day (BID) | ORAL | 0 refills | Status: DC

## 2016-11-05 MED ORDER — POTASSIUM CHLORIDE CRYS ER 20 MEQ PO TBCR
40.0000 meq | EXTENDED_RELEASE_TABLET | Freq: Once | ORAL | Status: DC
  Filled 2016-11-05: qty 2

## 2016-11-05 MED ORDER — SODIUM CHLORIDE 0.9 % IV BOLUS (SEPSIS)
1000.0000 mL | Freq: Once | INTRAVENOUS | Status: AC
  Administered 2016-11-05: 1000 mL via INTRAVENOUS

## 2016-11-05 MED ORDER — ONDANSETRON 4 MG PO TBDP: 4.0000 mg | ORAL_TABLET | Freq: Three times a day (TID) | ORAL | 0 refills | Status: DC | PRN

## 2016-11-05 MED ORDER — PROCHLORPERAZINE EDISYLATE 5 MG/ML IJ SOLN
10.0000 mg | Freq: Once | INTRAMUSCULAR | Status: AC
  Administered 2016-11-05: 10 mg via INTRAVENOUS
  Filled 2016-11-05: qty 2

## 2016-11-05 MED ORDER — DIPHENHYDRAMINE HCL 50 MG/ML IJ SOLN
25.0000 mg | Freq: Once | INTRAMUSCULAR | Status: AC
  Administered 2016-11-05: 25 mg via INTRAVENOUS
  Filled 2016-11-05: qty 1

## 2016-11-05 MED ORDER — POTASSIUM CHLORIDE ER 10 MEQ PO TBCR: 20.0000 meq | EXTENDED_RELEASE_TABLET | Freq: Two times a day (BID) | ORAL | 0 refills | Status: DC

## 2016-11-05 NOTE — ED Notes (Signed)
Ambulated pt in hallway. Pt denies pain, discomfort, dizziness. Reports feeling steady and normal.

## 2016-11-05 NOTE — ED Provider Notes (Signed)
[facility] DEPT Provider Note   CSN: [id number] Arrival date & time: 11/05/16  4765     History   Chief Complaint Chief Complaint  Patient presents with  . Headache  . Numbness    HPI Danielle Hawkins is a 46 y.o. female.  HPI   Patient is a 46 year old female with history of anxiety, anemia, hypertension who presents the ED with complaint of headache, onset 3 days. Patient reports having gradual onset worsening "bandlike headache" for the past few days. Endorses associated nausea and tension/muscle soreness to her upper back and shoulders. Denies any aggravating or alleviating factors. She notes yesterday evening around 8 PM she began to have a tingling sensation to her left lower face and jaw. He reports taking ibuprofen at home with mild intermittent relief of her headache. She reports having mild left eye watering and left-sided rhinorrhea but notes his symptoms have been present for the past week prior to onset of headache. Denies history of similar headaches. Denies history of migraines. Pt denies fever, neck stiffness, visual changes, photophobia, sore throat, ear pain/drainage, cough, SOB, CP, abdominal pain, N/V, urinary symptoms, numbness, weakness, seizures, syncope, rash.  Denies any recent fall, trauma or head injury.  Past Medical History:  Diagnosis Date  . Allergy   . Anemia   . Anxiety   . Blood transfusion without reported diagnosis   . Encounter for blood transfusion   . Fibroid   . 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 Hypertension   . Renal disorder    kidney stones    There are no active problems to display for this patient.   Past Surgical History:  Procedure Laterality Date  . GYNECOLOGIC CRYOSURGERY  early 20's  . keloid removal    . LITHOTRIPSY      OB History    Gravida Para Term Preterm AB Living   0             SAB TAB Ectopic Multiple Live Births                   Home Medications    Prior to Admission  medications   Medication Sig Start Date End Date Taking? Authorizing Provider  ALPRAZolam Duanne Moron) 0.5 MG tablet Take 1 tablet (0.5 mg total) by mouth 2 (two) times daily as needed for anxiety. 08/09/16  Yes Wendie Agreste, MD  amLODipine (NORVASC) 5 MG tablet TAKE ONE TABLET BY MOUTH ONCE DAILY. 08/09/16  Yes Wendie Agreste, MD  chlorthalidone (HYGROTON) 25 MG tablet Take 0.5 tablets (12.5 mg total) by mouth daily. 08/09/16  Yes Wendie Agreste, MD  norgestimate-ethinyl estradiol (SPRINTEC 28) 0.25-35 MG-MCG tablet Take 1 tablet by mouth daily. 04/09/16  Yes Historical Provider, MD  phentermine (ADIPEX-P) 37.5 MG tablet Take 37.5 mg by mouth daily before breakfast.   Yes Historical Provider, MD  naproxen (NAPROSYN) 500 MG tablet Take 1 tablet (500 mg total) by mouth 2 (two) times daily. 11/05/16   Nona Dell, PA-C  ondansetron (ZOFRAN ODT) 4 MG disintegrating tablet Take 1 tablet (4 mg total) by mouth every 8 (eight) hours as needed for nausea or vomiting. 11/05/16   Nona Dell, PA-C  potassium chloride (K-DUR) 10 MEQ tablet Take 2 tablets (20 mEq total) by mouth 2 (two) times daily. 11/05/16 11/08/16  Nona Dell, PA-C    Family History Family History  Problem Relation Age of Onset  . Hypertension Mother   . Hyperlipidemia  Mother   . Diabetes Mother   . Heart disease Mother   . Hypertension Father   . Breast cancer Maternal Aunt 46  . Breast cancer Maternal Aunt 50    Social History Social History  Substance Use Topics  . Smoking status: Former Research scientist (life sciences)  . Smokeless tobacco: Never Used  . Alcohol use 0.6 oz/week    1 Glasses of wine per week     Comment: Rare     Allergies   Diflucan [fluconazole]; Neosporin [neomycin-bacitracin zn-polymyx]; Niacin and related; Other; Percocet [oxycodone-acetaminophen]; Red dye; and Tramadol   Review of Systems Review of Systems  HENT: Positive for rhinorrhea.   Eyes: Positive for discharge (watery).    Gastrointestinal: Positive for nausea.  Musculoskeletal: Positive for back pain (upper) and neck pain.  Neurological: Positive for headaches.  All other systems reviewed and are negative.    Physical Exam Updated Vital Signs BP 133/80 (BP Location: Right Arm)   Pulse 89   Temp 98.3 F (36.8 C) (Oral)   Resp 18   Ht 5\' 9"  (1.753 m)   Wt 95.3 kg   LMP 10/13/2016   SpO2 98%   BMI 31.01 kg/m   Physical Exam  Constitutional: She is oriented to person, place, and time. She appears well-developed and well-nourished. No distress.  HENT:  Head: Normocephalic and atraumatic.  Right Ear: Tympanic membrane normal.  Left Ear: Tympanic membrane normal.  Nose: Nose normal. Right sinus exhibits no maxillary sinus tenderness and no frontal sinus tenderness. Left sinus exhibits no maxillary sinus tenderness and no frontal sinus tenderness.  Mouth/Throat: Uvula is midline, oropharynx is clear and moist and mucous membranes are normal. No oropharyngeal exudate, posterior oropharyngeal edema, posterior oropharyngeal erythema or tonsillar abscesses. No tonsillar exudate.  Eyes: Conjunctivae and EOM are normal. Pupils are equal, round, and reactive to light. Right eye exhibits no discharge. Left eye exhibits no discharge. No scleral icterus.  No nystagmus  Neck: Normal range of motion. Neck supple.  Cardiovascular: Normal rate, regular rhythm, normal heart sounds and intact distal pulses.   Pulmonary/Chest: Effort normal and breath sounds normal. No respiratory distress. She has no wheezes. She has no rales. She exhibits no tenderness.  Abdominal: Soft. Bowel sounds are normal. She exhibits no distension and no mass. There is no tenderness. There is no rebound and no guarding. No hernia.  Musculoskeletal: Normal range of motion. She exhibits no edema or tenderness.  No midline C, T, or L tenderness. Mild TTP over bilateral upper trapezius. Full range of motion of neck and back. Full range of motion of  bilateral upper and lower extremities, with 5/5 strength. Sensation intact. 2+ radial and PT pulses. Cap refill <2 seconds. Patient able to stand and ambulate without assistance.    Lymphadenopathy:    She has no cervical adenopathy.  Neurological: She is alert and oriented to person, place, and time. She has normal strength. A cranial nerve deficit is present. No sensory deficit. She displays a negative Romberg sign. Coordination normal.  Pt reports decreased sensation to left V3 distribution, remaining CNs intact.  Skin: Skin is warm and dry. She is not diaphoretic.  Nursing note and vitals reviewed.    ED Treatments / Results  Labs (all labs ordered are listed, but only abnormal results are displayed) Labs Reviewed  CBC WITH DIFFERENTIAL/PLATELET - Abnormal; Notable for the following:       Result Value   Hemoglobin 11.4 (*)    HCT 34.6 (*)    All  other components within normal limits  BASIC METABOLIC PANEL - Abnormal; Notable for the following:    Potassium 2.8 (*)    Calcium 8.7 (*)    All other components within normal limits  I-STAT BETA HCG BLOOD, ED ([facility], [facility], [facility] ONLY)    EKG  EKG Interpretation None       Radiology Ct Head Wo Contrast  Result Date: 11/05/2016 CLINICAL DATA:  Headache for 4 days EXAM: CT HEAD WITHOUT CONTRAST TECHNIQUE: Contiguous axial images were obtained from the base of the skull through the vertex without intravenous contrast. COMPARISON:  December 25, 2014 FINDINGS: Brain: The ventricles are normal in size and configuration. Mild prominence of the cisterna magna is an anatomic variant. There is no intracranial mass, hemorrhage, extra-axial fluid collection, or midline shift. Gray-white compartments appear normal. No acute infarct evident. Vascular: There is no appreciable hyperdense vessel. There is no appreciable vascular calcification. Skull: Bony calvarium appears intact. Sinuses/Orbits: There is mild mucosal thickening of several ethmoid air cells  bilaterally. There is mild mucosal thickening in each sphenoid sinus. Other visualized paranasal sinuses are clear. Visualized orbits appear symmetric bilaterally. Other: Mastoid air cells are clear. IMPRESSION: Mild mucosal thickening in several paranasal sinus regions. No intracranial mass hemorrhage, or extra-axial fluid collection. Gray-white compartments appear normal. Electronically Signed   By: Lowella Grip III M.D.   On: 11/05/2016 10:52    Procedures Procedures (including critical care time)  Medications Ordered in ED Medications  potassium chloride SA (K-DUR,KLOR-CON) CR tablet 40 mEq (not administered)  sodium chloride 0.9 % bolus 1,000 mL (1,000 mLs Intravenous New Bag/Given 11/05/16 1003)  ketorolac (TORADOL) 30 MG/ML injection 30 mg (30 mg Intravenous Given 11/05/16 1003)  prochlorperazine (COMPAZINE) injection 10 mg (10 mg Intravenous Given 11/05/16 1003)  diphenhydrAMINE (BENADRYL) injection 25 mg (25 mg Intravenous Given 11/05/16 1004)     Initial Impression / Assessment and Plan / ED Course  I have reviewed the triage vital signs and the nursing notes.  Pertinent labs & imaging results that were available during my care of the patient were reviewed by me and considered in my medical decision making (see chart for details).    Patient presents with gradual onset constant bandlike headache that started 3 days ago with associated tension across her upper back, nausea and tingling sensation to her left lower face/jaw. No relief with ibuprofen at home. Denies fever or neck stiffness. Denies history of migraines. VSS. On exam patient reported tingling/decreased sensation over left V3 distribution of face, remaining cranial nerves intact. No other neuro deficits. Exam otherwise unremarkable. Patient given IV fluids and migraine cocktail. Pregnancy negative. Labs revealed potassium 2.8, patient given oral potassium supplement in the ED. Remaining labs unremarkable. CT head negative.  Patient's headache appears to be consistent with tension headache. On reevaluation patient reports significant improvement of symptoms. Patient able to stand and ambulate, no ataxia noted. Plan to discharge patient home with potassium supplement and naproxen for tension headache. Advised patient to follow up with PCP within the next 3-4 days for follow-up evaluation and repeat potassium level. Discussed return precautions.  Final Clinical Impressions(s) / ED Diagnoses   Final diagnoses:  Tension headache  Hypokalemia    New Prescriptions New Prescriptions   NAPROXEN (NAPROSYN) 500 MG TABLET    Take 1 tablet (500 mg total) by mouth 2 (two) times daily.   ONDANSETRON (ZOFRAN ODT) 4 MG DISINTEGRATING TABLET    Take 1 tablet (4 mg total) by mouth every 8 (eight) hours  as needed for nausea or vomiting.   POTASSIUM CHLORIDE (K-DUR) 10 MEQ TABLET    Take 2 tablets (20 mEq total) by mouth 2 (two) times daily.     Chesley Noon [place], [place] 11/05/16 1157    Veryl Speak, MD 11/06/16 2258

## 2016-11-05 NOTE — ED Notes (Signed)
Patient transported to CT 

## 2016-11-05 NOTE — ED Triage Notes (Signed)
Pt states headache that feels like "band wrapped around head", since Fri.  States some nausea.  States L facial/L lip numbness since last night.

## 2016-11-05 NOTE — Discharge Instructions (Signed)
Take your medications as prescribed as needed for pain relief. I recommend following up with your primary care provider within the next 3-4 days for follow-up evaluation and to repeat her potassium level. Please return to the Emergency Department if symptoms worsen or new onset of fever, neck stiffness, visual changes, light sensitivity, abdominal pain, vomiting, urinary symptoms, numbness, tingling, weakness, seizures, syncope, rash.

## 2016-11-08 ENCOUNTER — Other Ambulatory Visit: Payer: Self-pay | Admitting: Family Medicine

## 2016-11-08 DIAGNOSIS — I10 Essential (primary) hypertension: Secondary | ICD-10-CM

## 2016-11-28 ENCOUNTER — Telehealth: Payer: Self-pay | Admitting: Family Medicine

## 2016-11-28 DIAGNOSIS — F410 Panic disorder [episodic paroxysmal anxiety] without agoraphobia: Secondary | ICD-10-CM

## 2016-11-28 DIAGNOSIS — F411 Generalized anxiety disorder: Secondary | ICD-10-CM

## 2016-11-28 NOTE — Telephone Encounter (Signed)
PATIENT HAD HER ANNUAL PHYSICAL DONE WITH DR. Carlota Raspberry ON JAN. 25, 2018. HE PRESCRIBED HER TO HAVE ALPRAZOLAM (XANAX) 0.5 MG, BUT SHE NEVER GOT IT FILLED. IF HE GAVE HER THE PRESCRIPTION TO TAKE TO HER PHARMACY WHEN SHE LEFT SHE CAN NOT FIND IT NOW. SHE WOULD LIKE TO HAVE IT SENT ELECTRONICALLY SO SHE CAN PICK IT UP. BEST PHONE [phone] (CELL) PHARMACY CHOICE IS [facility] IN [place]. [place]

## 2016-11-29 NOTE — Telephone Encounter (Signed)
08/09/16 last ov and refill 

## 2016-12-03 MED ORDER — ALPRAZOLAM 0.5 MG PO TABS: 0.5000 mg | ORAL_TABLET | Freq: Two times a day (BID) | ORAL | 0 refills | Status: DC | PRN

## 2016-12-03 NOTE — Telephone Encounter (Signed)
Verified with pharmacy. Has not filled since 11/22/15. New Rx given for Xanax.

## 2016-12-03 NOTE — Telephone Encounter (Signed)
Faxed to [facility]

## 2017-02-05 ENCOUNTER — Telehealth: Payer: Self-pay | Admitting: Family Medicine

## 2017-02-05 NOTE — Telephone Encounter (Signed)
Patient stated that she was billed for her CPE and an office visit and she states she was not treated for anything outside of a CPE because Dr Carlota Raspberry would not even let her talk about anything because he cant stating she would be charged if they did. She spoke with Eddie Dibbles and he explained that because of what was documented in Dr Vonna Kotyk notes to [employer] they charged her an additional OV, states there are notes in there about high bp, anxiety, and tendinitis. Patient states she only spoke to him about taking meds for anxiety nothing else outside of a CPE. She stated multiple times that she works for a Teacher, music and knows how it works with [employer] and she knows not to talk about anything else because she would be billed and she wants it changed in her chart because she does not feel like she should have been charged for this at all and that Dr Carlota Raspberry needs to change his notes.   Her call back number is [phone] and she states if she does not answer to please leave her a detailed message with a way to get back in touch with Dr Carlota Raspberry so she can get this straight and refresh his memory on what happened that day during her visit.  Marking high priority because patient seemed very upset and wanted this handled as soon as possible and I feel like she will call back again today if not addressed by someone.

## 2017-02-07 ENCOUNTER — Ambulatory Visit: Payer: Commercial Managed Care - HMO | Admitting: Family Medicine

## 2017-02-07 NOTE — Telephone Encounter (Signed)
See previous note. Based on chart review, I can change that level of service to physical only, as conditions were overall stable and just discussed new issue of suspected tendinitis.  I do see where she was prescribed potassium on April 23rd in the ER, but that was for a short term supply and follow up testing was recommended.. I do not mind placing lab only order if she wants to return to have that tested here (lab only visit initially ok, but if low, may need OV to discuss further workup/treatment). Let me know what she would like to do.

## 2017-02-07 NOTE — Telephone Encounter (Signed)
I reviewed notes from that date of service and we did discuss tendinitis and her shoulder pain, but only prescribed treatment were home exercises, over-the-counter Tylenol.  No prescription medication..  We did discuss anxiety, but that was felt to be overall stable. We also briefly discussed fatigue and some blood work was obtained with plan to follow-up if symptoms persisted and normal blood testing.  I also was reviewing her lab work and it appears her potassium was fairly low when checked April 23. Would recommend rechecking this as soon as possible if this has not been repeated.

## 2017-02-08 NOTE — Telephone Encounter (Signed)
Please see message. °

## 2017-02-27 ENCOUNTER — Other Ambulatory Visit: Payer: Self-pay | Admitting: Family Medicine

## 2017-02-27 NOTE — Telephone Encounter (Signed)
Pt calling for a med refill on Potassium she has an appointment in Sept

## 2017-03-07 ENCOUNTER — Telehealth: Payer: Self-pay | Admitting: Family Medicine

## 2017-03-07 NOTE — Telephone Encounter (Signed)
Left message to return message 

## 2017-03-07 NOTE — Telephone Encounter (Signed)
Patient called in returning [place] message about needing an apt she would like for Sharee Pimple to call her back as soon as possible concerning this.  Her number is [phone]

## 2017-03-08 NOTE — Telephone Encounter (Signed)
Please clarify. It appear she only had 12 tablets prescribed in April. Has she continued to take potassium over-the-counter or had other prescription, and what dose?

## 2017-03-13 NOTE — Telephone Encounter (Signed)
See prior note - please clarify.

## 2017-03-14 NOTE — Telephone Encounter (Signed)
LMVM- gave Dr. Carlota Raspberry message re: potassium refill   Ask pt to call for appt for labs or advise she dose not need the potassium.

## 2017-03-21 ENCOUNTER — Encounter: Payer: Self-pay | Admitting: Family Medicine

## 2017-03-21 ENCOUNTER — Ambulatory Visit (INDEPENDENT_AMBULATORY_CARE_PROVIDER_SITE_OTHER): Payer: 59 | Admitting: Family Medicine

## 2017-03-21 VITALS — BP 122/79 | HR 77 | Temp 99.0°F | Resp 16 | Ht 69.0 in | Wt 220.0 lb

## 2017-03-21 DIAGNOSIS — E876 Hypokalemia: Secondary | ICD-10-CM | POA: Diagnosis not present

## 2017-03-21 DIAGNOSIS — I10 Essential (primary) hypertension: Secondary | ICD-10-CM

## 2017-03-21 DIAGNOSIS — R5383 Other fatigue: Secondary | ICD-10-CM

## 2017-03-21 DIAGNOSIS — N926 Irregular menstruation, unspecified: Secondary | ICD-10-CM | POA: Diagnosis not present

## 2017-03-21 LAB — POCT URINE PREGNANCY: Preg Test, Ur: NEGATIVE

## 2017-03-21 MED ORDER — POTASSIUM CHLORIDE ER 10 MEQ PO TBCR: 10.0000 meq | EXTENDED_RELEASE_TABLET | Freq: Every day | ORAL | 0 refills | Status: DC

## 2017-03-21 MED ORDER — AMLODIPINE BESYLATE 5 MG PO TABS: ORAL_TABLET | ORAL | 1 refills | Status: DC

## 2017-03-21 MED ORDER — CHLORTHALIDONE 25 MG PO TABS: 12.5000 mg | ORAL_TABLET | Freq: Every day | ORAL | 1 refills | Status: DC

## 2017-03-21 NOTE — Patient Instructions (Addendum)
I will check potassium level today, and should have results back in the next day or 2. For now restart potassium once per day until we see those readings. I refilled your other blood pressure medications as is looked okay. I will check some electrolytes and blood counts for fatigue, but if that worsens, or other new worsening symptoms, be seen here or the emergency room.  Return to the clinic or go to the nearest emergency room if any of your symptoms worsen or new symptoms occur.  If we do not see a reason for fatigue on your blood work, and symptoms persist, recheck in the next week to 2 weeks at the most to look into other causes.   IF you received an x-ray today, you will receive an invoice from [facility]. Please contact [facility] at [phone] with questions or concerns regarding your invoice.   IF you received labwork today, you will receive an invoice from [facility]. Please contact [facility] at [phone] with questions or concerns regarding your invoice.   Our billing staff will not be able to assist you with questions regarding bills from these companies.  You will be contacted with the lab results as soon as they are available. The fastest way to get your results is to activate your My Chart account. Instructions are located on the last page of this paperwork. If you have not heard from [place] regarding the results in 2 weeks, please contact this office.

## 2017-03-21 NOTE — Progress Notes (Signed)
Subjective:  By signing my name below, I, Moises Blood, attest that this documentation has been prepared under the direction and in the presence of [name], MD. Electronically Signed: Moises Blood, [place]. 03/21/2017 , 5:57 PM .  Patient was seen in Room 10 .   Patient ID: Danielle Hawkins, female    DOB: 20-May-1971, 46 y.o.   MRN: [id number] Chief Complaint  Patient presents with  . Fatigue  . Medication Refill    norvasc, hygroton, potassium   HPI Danielle Hawkins is a 46 y.o. female Here for fatigue and multiple medications refill. He was last seen in Jan for a physical.   HTN Lab Results  Component Value Date   CREATININE 0.99 11/05/2016   She takes chlorthalidone 25mg  QD and Norvasc 5mg  QD. At her last visit, her BP was stable at 112/72. She denies staying hydrated throughout the day.   History of low potassium She had low potassium of 2.8 in April when seen in ER. She was given short course of potassium 37mEq BID for 3 days. She was recommended 3-4 day follow up at April 23rd ER visit. She has not rechecked this recently.   Fatigue She states feeling extremely tired for a few months now. She sometimes notices trying to catch her breath, but believes this is due to anxiety. She denies chest pain or chest tightness.   Her last period was 2 weeks ago. She's missed a few pills of her Sprintec over the past few days with some irregular bleeding and spotting. She denies chance of pregnancy. She denies prior irregular bleeding. She denies abdominal pain, nausea, vomiting, or breast tenderness. Her last sexual activity was in May.   Work: She works at [employer].   There are no active problems to display for this patient.  Past Medical History:  Diagnosis Date  . Allergy   . Anemia   . Anxiety   . Blood transfusion without reported diagnosis   . Encounter for blood transfusion   . Fibroid   . 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  Hypertension   . Renal disorder    kidney stones   Past Surgical History:  Procedure Laterality Date  . GYNECOLOGIC CRYOSURGERY  early 20's  . keloid removal    . LITHOTRIPSY     Allergies  Allergen Reactions  . Diflucan [Fluconazole] Hives, Itching and Swelling  . Neosporin [Neomycin-Bacitracin Zn-Polymyx] Other (See Comments)    "burning"  . Niacin And Related Itching  . Other Hives and Itching    IV dye  . Percocet [Oxycodone-Acetaminophen] Hives and Itching  . Red Dye Hives and Itching  . Tramadol Hives and Itching   Prior to Admission medications   Medication Sig Start Date End Date Taking? Authorizing Provider  ALPRAZolam Duanne Moron) 0.5 MG tablet Take 1 tablet (0.5 mg total) by mouth 2 (two) times daily as needed for anxiety. 12/03/16   Wendie Agreste, MD  amLODipine (NORVASC) 5 MG tablet TAKE ONE TABLET BY MOUTH ONCE DAILY. 08/09/16   Wendie Agreste, MD  chlorthalidone (HYGROTON) 25 MG tablet Take 0.5 tablets (12.5 mg total) by mouth daily. 08/09/16   Wendie Agreste, MD  chlorthalidone (HYGROTON) 25 MG tablet TAKE ONE-HALF TABLET BY MOUTH ONCE DAILY 11/08/16   Gale Journey, Damaris Hippo, PA-C  naproxen (NAPROSYN) 500 MG tablet Take 1 tablet (500 mg total) by mouth 2 (two) times daily. 11/05/16   Nona Dell, PA-C  norgestimate-ethinyl estradiol (  SPRINTEC 28) 0.25-35 MG-MCG tablet Take 1 tablet by mouth daily. 04/09/16   [provider]  ondansetron (ZOFRAN ODT) 4 MG disintegrating tablet Take 1 tablet (4 mg total) by mouth every 8 (eight) hours as needed for nausea or vomiting. 11/05/16   Nona Dell, PA-C  phentermine (ADIPEX-P) 37.5 MG tablet Take 37.5 mg by mouth daily before breakfast.    [provider]  potassium chloride (K-DUR) 10 MEQ tablet Take 2 tablets (20 mEq total) by mouth 2 (two) times daily. 11/05/16 11/08/16  Nona Dell, PA-C   Social History   Social History  . Marital status: Single    Spouse name: N/A  .  Number of children: N/A  . Years of education: N/A   Occupational History  . Not on file.   Social History Main Topics  . Smoking status: Former Research scientist (life sciences)  . Smokeless tobacco: Never Used  . Alcohol use 0.6 oz/week    1 Glasses of wine per week     Comment: Rare  . Drug use: No  . Sexual activity: Yes    Birth control/ protection: Pill   Other Topics Concern  . Not on file   Social History Narrative  . No narrative on file   Review of Systems  Constitutional: Negative for fatigue and unexpected weight change.  Respiratory: Negative for chest tightness and shortness of breath.   Cardiovascular: Negative for chest pain, palpitations and leg swelling.  Gastrointestinal: Negative for abdominal pain and blood in stool.  Neurological: Negative for dizziness, syncope, light-headedness and headaches.       Objective:   Physical Exam  Constitutional: She is oriented to person, place, and time. She appears well-developed and well-nourished.  HENT:  Head: Normocephalic and atraumatic.  Eyes: Pupils are equal, round, and reactive to light. Conjunctivae and EOM are normal.  Neck: Carotid bruit is not present.  Cardiovascular: Normal rate, regular rhythm, normal heart sounds and intact distal pulses.   Pulmonary/Chest: Effort normal and breath sounds normal.  Abdominal: Soft. She exhibits no pulsatile midline mass. There is no tenderness.  Neurological: She is alert and oriented to person, place, and time. She displays a negative Romberg sign. Coordination and gait normal.  No pronator drift, non focal neuro exam  Skin: Skin is warm and dry.  Psychiatric: She has a normal mood and affect. Her behavior is normal.  Vitals reviewed.   Vitals:   03/21/17 1713  BP: 122/79  Pulse: 77  Resp: 16  Temp: 99 F (37.2 C)  TempSrc: Oral  SpO2: 98%  Weight: 220 lb (99.8 kg)  Height: 5\' 9"  (1.753 m)   Orthostatic VS for the past 24 hrs (Last 3 readings):  BP- Lying Pulse- Lying BP-  Sitting Pulse- Sitting BP- Standing at 0 minutes Pulse- Standing at 0 minutes  03/21/17 1810 129/75 79 127/80 79 121/79 81   EKG: sinus rhythm, no acute findings.   Results for orders placed or performed in visit on 03/21/17  POCT urine pregnancy  Result Value Ref Range   Preg Test, Ur Negative Negative   Results for orders placed or performed in visit on 94/85/46  Basic metabolic panel  Result Value Ref Range   Glucose 71 65 - 99 mg/dL   BUN 15 6 - 24 mg/dL   Creatinine, Ser 0.92 0.57 - 1.00 mg/dL   GFR calc non Af Amer 75 >59 mL/min/1.73   GFR calc Af Amer 86 >59 mL/min/1.73   BUN/Creatinine Ratio 16 9 -  23   Sodium 142 134 - 144 mmol/L   Potassium 3.5 3.5 - 5.2 mmol/L   Chloride 101 96 - 106 mmol/L   CO2 26 20 - 29 mmol/L   Calcium 9.8 8.7 - 10.2 mg/dL  CBC  Result Value Ref Range   WBC 8.7 3.4 - 10.8 x10E3/uL   RBC 4.16 3.77 - 5.28 x10E6/uL   Hemoglobin 10.8 (L) 11.1 - 15.9 g/dL   Hematocrit 33.9 (L) 34.0 - 46.6 %   MCV 82 79 - 97 fL   MCH 26.0 (L) 26.6 - 33.0 pg   MCHC 31.9 31.5 - 35.7 g/dL   RDW 14.7 12.3 - 15.4 %   Platelets 396 (H) 150 - 379 x10E3/uL  TSH  Result Value Ref Range   TSH 1.390 0.450 - 4.500 uIU/mL  POCT urine pregnancy  Result Value Ref Range   Preg Test, Ur Negative Negative       Assessment & Plan:   Danielle Hawkins is a 46 y.o. female Fatigue, unspecified type - Plan: EKG 12-Lead, CBC, TSH, POCT urine pregnancy, Orthostatic vital signs  -Negative hCG, orthostatics okay. May be multifactorial. Potassium low normal, but still normal. Mild anemia. Can discuss further at follow-up within 2 weeks.  Essential hypertension - Plan: Basic metabolic panel, amLODipine (NORVASC) 5 MG tablet, chlorthalidone (HYGROTON) 25 MG tablet  -Stable. Continue Norvasc and chlorthalidone same doses.  Hypokalemia - Plan: Basic metabolic panel, potassium chloride (K-DUR) 10 MEQ tablet  - 10 mEq potassium per day prescription given, low normal potassium on testing.  Can recheck levels in the next few weeks to make sure stable at that dose.  Irregular menstrual bleeding - Plan: POCT urine pregnancy  -Negative hCG. If persistent irregularities, can look at other causes. TSH was also normal.  Meds ordered this encounter  Medications  . amLODipine (NORVASC) 5 MG tablet    Sig: TAKE ONE TABLET BY MOUTH ONCE DAILY.    Dispense:  90 tablet    Refill:  1  . chlorthalidone (HYGROTON) 25 MG tablet    Sig: Take 0.5 tablets (12.5 mg total) by mouth daily.    Dispense:  90 tablet    Refill:  1  . potassium chloride (K-DUR) 10 MEQ tablet    Sig: Take 1 tablet (10 mEq total) by mouth daily.    Dispense:  30 tablet    Refill:  0   Patient Instructions   I will check potassium level today, and should have results back in the next day or 2. For now restart potassium once per day until we see those readings. I refilled your other blood pressure medications as is looked okay. I will check some electrolytes and blood counts for fatigue, but if that worsens, or other new worsening symptoms, be seen here or the emergency room.  Return to the clinic or go to the nearest emergency room if any of your symptoms worsen or new symptoms occur.  If we do not see a reason for fatigue on your blood work, and symptoms persist, recheck in the next week to 2 weeks at the most to look into other causes.   IF you received an x-ray today, you will receive an invoice from [facility]. Please contact [facility] Rad[facility] at [phone] with questions or concerns regarding your invoice.   IF you received labwork today, you will receive an invoice from [place]. Please contact [facility] at [phone] with questions or concerns regarding your invoice.   Our billing staff will not be  able to assist you with questions regarding bills from these companies.  You will be contacted with the lab results as soon as they are available. The fastest way to get your results is to  activate your My Chart account. Instructions are located on the last page of this paperwork. If you have not heard from [place] regarding the results in 2 weeks, please contact this office.       I personally performed the services described in this documentation, which was scribed in my presence. The recorded information has been reviewed and considered for accuracy and completeness, addended by me as needed, and agree with information above.  Signed,   [name], MD [facility].  03/24/17 1:54 PM

## 2017-03-23 LAB — BASIC METABOLIC PANEL
BUN/Creatinine Ratio: 16 (ref 9–23)
BUN: 15 mg/dL (ref 6–24)
CALCIUM: 9.8 mg/dL (ref 8.7–10.2)
CO2: 26 mmol/L (ref 20–29)
CREATININE: 0.92 mg/dL (ref 0.57–1.00)
Chloride: 101 mmol/L (ref 96–106)
GFR, EST AFRICAN AMERICAN: 86 mL/min/{1.73_m2} (ref >59)
GFR, EST NON AFRICAN AMERICAN: 75 mL/min/{1.73_m2} (ref >59)
Glucose: 71 mg/dL (ref 65–99)
Potassium: 3.5 mmol/L (ref 3.5–5.2)
Sodium: 142 mmol/L (ref 134–144)

## 2017-03-23 LAB — CBC
HEMOGLOBIN: 10.8 g/dL — AB (ref 11.1–15.9)
Hematocrit: 33.9 % — ABNORMAL LOW (ref 34.0–46.6)
MCH: 26 pg — AB (ref 26.6–33.0)
MCHC: 31.9 g/dL (ref 31.5–35.7)
MCV: 82 fL (ref 79–97)
Platelets: 396 10*3/uL — ABNORMAL HIGH (ref 150–379)
RBC: 4.16 x10E6/uL (ref 3.77–5.28)
RDW: 14.7 % (ref 12.3–15.4)
WBC: 8.7 10*3/uL (ref 3.4–10.8)

## 2017-03-23 LAB — TSH: TSH: 1.39 u[IU]/mL (ref 0.450–4.500)

## 2017-04-01 ENCOUNTER — Encounter: Payer: Self-pay | Admitting: *Deleted

## 2017-04-19 ENCOUNTER — Telehealth: Payer: Self-pay | Admitting: Family Medicine

## 2017-04-19 NOTE — Telephone Encounter (Signed)
Pt called back stating that she had left a vm message for someone to give her a call back to read her lab results to her nobody has got in contact with pt.. Pt wants her lab results read told her about mychart she is not interested in that..  Please advise

## 2017-04-22 NOTE — Telephone Encounter (Signed)
Results given.

## 2017-11-20 ENCOUNTER — Telehealth: Payer: Self-pay | Admitting: Family Medicine

## 2017-11-20 NOTE — Telephone Encounter (Signed)
Copied from [place] [phone]. Topic: Quick Communication - Rx Refill/Question >> Nov 20, 2017  2:50 PM Synthia Innocent wrote: Medication: amLODipine (NORVASC) 5 MG tablet and chlorthalidone (HYGROTON) 25 MG tablet  Has the patient contacted their pharmacy? Yes.   (Agent: If no, request that the patient contact the pharmacy for the refill.) Preferred Pharmacy (with phone number or street name): [facility] in [place]: Please be advised that RX refills may take up to 3 business days. We ask that you follow-up with your pharmacy.

## 2017-11-21 ENCOUNTER — Other Ambulatory Visit: Payer: Self-pay

## 2017-11-21 DIAGNOSIS — I10 Essential (primary) hypertension: Secondary | ICD-10-CM

## 2017-11-21 MED ORDER — AMLODIPINE BESYLATE 5 MG PO TABS: ORAL_TABLET | ORAL | 0 refills | Status: DC

## 2017-11-21 MED ORDER — CHLORTHALIDONE 25 MG PO TABS: 12.5000 mg | ORAL_TABLET | Freq: Every day | ORAL | 0 refills | Status: DC

## 2017-12-30 ENCOUNTER — Ambulatory Visit (INDEPENDENT_AMBULATORY_CARE_PROVIDER_SITE_OTHER): Payer: 59 | Admitting: Family Medicine

## 2017-12-30 ENCOUNTER — Encounter: Payer: Self-pay | Admitting: Family Medicine

## 2017-12-30 ENCOUNTER — Other Ambulatory Visit: Payer: Self-pay

## 2017-12-30 VITALS — BP 122/73 | HR 84 | Temp 98.4°F | Ht 69.0 in | Wt 223.2 lb

## 2017-12-30 DIAGNOSIS — F439 Reaction to severe stress, unspecified: Secondary | ICD-10-CM | POA: Diagnosis not present

## 2017-12-30 DIAGNOSIS — Z0000 Encounter for general adult medical examination without abnormal findings: Secondary | ICD-10-CM | POA: Diagnosis not present

## 2017-12-30 DIAGNOSIS — F411 Generalized anxiety disorder: Secondary | ICD-10-CM

## 2017-12-30 DIAGNOSIS — D649 Anemia, unspecified: Secondary | ICD-10-CM

## 2017-12-30 DIAGNOSIS — B351 Tinea unguium: Secondary | ICD-10-CM

## 2017-12-30 DIAGNOSIS — Z13220 Encounter for screening for lipoid disorders: Secondary | ICD-10-CM

## 2017-12-30 DIAGNOSIS — I10 Essential (primary) hypertension: Secondary | ICD-10-CM

## 2017-12-30 DIAGNOSIS — F410 Panic disorder [episodic paroxysmal anxiety] without agoraphobia: Secondary | ICD-10-CM | POA: Diagnosis not present

## 2017-12-30 MED ORDER — CHLORTHALIDONE 25 MG PO TABS: 12.5000 mg | ORAL_TABLET | Freq: Every day | ORAL | 2 refills | Status: DC

## 2017-12-30 MED ORDER — ALPRAZOLAM 0.5 MG PO TABS: 0.5000 mg | ORAL_TABLET | Freq: Two times a day (BID) | ORAL | 0 refills | Status: DC | PRN

## 2017-12-30 MED ORDER — CHLORTHALIDONE 25 MG PO TABS: 12.5000 mg | ORAL_TABLET | Freq: Every day | ORAL | 0 refills | Status: DC

## 2017-12-30 MED ORDER — AMLODIPINE BESYLATE 5 MG PO TABS: ORAL_TABLET | ORAL | 2 refills | Status: DC

## 2017-12-30 MED ORDER — AMLODIPINE BESYLATE 5 MG PO TABS: ORAL_TABLET | ORAL | 0 refills | Status: DC

## 2017-12-30 NOTE — Patient Instructions (Addendum)
See information below on stress.  I did refill Xanax for temporary use, but if you need that medicine more frequently, please follow-up to discuss other options.  Here are some numbers for therapists in the area: Vivia Budge: [phone] Lyndee Leo Huprich: [phone]   No change in blood pressure medications for now.  I will check some blood work at your upcoming fasting lab visit.  Please try to have the blood work done in the next week.  I will also check your blood count at that time.  Areas on your toes may be early onychomycosis.  Can try over-the-counter treatments, but if other medication desired, let me know.    Fungal Nail Infection Fungal nail infection is a common fungal infection of the toenails or fingernails. This condition affects toenails more often than fingernails. More than one nail may be infected. The condition can be passed from person to person (is contagious). What are the causes? This condition is caused by a fungus. Several types of funguses can cause the infection. These funguses are common in moist and warm areas. If your hands or feet come into contact with the fungus, it may get into a crack in your fingernail or toenail and cause the infection. What increases the risk? The following factors may make you more likely to develop this condition:  Being female.  Having diabetes.  Being of older age.  Living with someone who has the fungus.  Walking barefoot in areas where the fungus thrives, such as showers or locker rooms.  Having poor circulation.  Wearing shoes and socks that cause your feet to sweat.  Having athlete's foot.  Having a nail injury or history of a recent nail surgery.  Having psoriasis.  Having a weak body defense system (immune system).  What are the signs or symptoms? Symptoms of this condition include:  A pale spot on the nail.  Thickening of the nail.  A nail that becomes yellow or brown.  A brittle or ragged nail edge.  A  crumbling nail.  A nail that has lifted away from the nail bed.  How is this diagnosed? This condition is diagnosed with a physical exam. Your health care provider may take a scraping or clipping from your nail to test for the fungus. How is this treated? Mild infections do not need treatment. If you have significant nail changes, treatment may include:  Oral antifungal medicines. You may need to take the medicine for several weeks or several months, and you may not see the results for a long time. These medicines can cause side effects. Ask your health care provider what problems to watch for.  Antifungal nail polish and nail cream. These may be used along with oral antifungal medicines.  Laser treatment of the nail.  Surgery to remove the nail. This may be needed for the most severe infections.  Treatment takes a long time, and the infection may come back. Follow these instructions at home: Medicines  Take or apply over-the-counter and prescription medicines only as told by your health care provider.  Ask your health care provider about using over-the-counter mentholated ointment on your nails. Lifestyle   Do not share personal items, such as towels or nail clippers.  Trim your nails often.  Wash and dry your hands and feet every day.  Wear absorbent socks, and change your socks frequently.  Wear shoes that allow air to circulate, such as sandals or canvas tennis shoes. Throw out old shoes.  Wear rubber gloves if  you are working with your hands in wet areas.  Do not walk barefoot in shower rooms or locker rooms.  Do not use a nail salon that does not use clean instruments.  Do not use artificial nails. General instructions  Keep all follow-up visits as told by your health care provider. This is important.  Use antifungal foot powder on your feet and in your shoes. Contact a health care provider if: Your infection is not getting better or it is getting worse after  several months. This information is not intended to replace advice given to you by your health care provider. Make sure you discuss any questions you have with your health care provider. Document Released: 06/29/2000 Document Revised: 12/08/2015 Document Reviewed: 01/03/2015 Elsevier Interactive Patient Education  2018 [place] and Stress Management Stress is a normal reaction to life events. It is what you feel when life demands more than you are used to or more than you can handle. Some stress can be useful. For example, the stress reaction can help you catch the last bus of the day, study for a test, or meet a deadline at work. But stress that occurs too often or for too long can cause problems. It can affect your emotional health and interfere with relationships and normal daily activities. Too much stress can weaken your immune system and increase your risk for physical illness. If you already have a medical problem, stress can make it worse. What are the causes? All sorts of life events may cause stress. An event that causes stress for one person may not be stressful for another person. Major life events commonly cause stress. These may be positive or negative. Examples include losing your job, moving into a new home, getting married, having a baby, or losing a loved one. Less obvious life events may also cause stress, especially if they occur day after day or in combination. Examples include working long hours, driving in traffic, caring for children, being in debt, or being in a difficult relationship. What are the signs or symptoms? Stress may cause emotional symptoms including, the following:  Anxiety. This is feeling worried, afraid, on edge, overwhelmed, or out of control.  Anger. This is feeling irritated or impatient.  Depression. This is feeling sad, down, helpless, or guilty.  Difficulty focusing, remembering, or making decisions.  Stress may cause physical symptoms,  including the following:  Aches and pains. These may affect your head, neck, back, stomach, or other areas of your body.  Tight muscles or clenched jaw.  Low energy or trouble sleeping.  Stress may cause unhealthy behaviors, including the following:  Eating to feel better (overeating) or skipping meals.  Sleeping too little, too much, or both.  Working too much or putting off tasks (procrastination).  Smoking, drinking alcohol, or using drugs to feel better.  How is this diagnosed? Stress is diagnosed through an assessment by your health care provider. Your health care provider will ask questions about your symptoms and any stressful life events.Your health care provider will also ask about your medical history and may order blood tests or other tests. Certain medical conditions and medicine can cause physical symptoms similar to stress. Mental illness can cause emotional symptoms and unhealthy behaviors similar to stress. Your health care provider may refer you to a mental health professional for further evaluation. How is this treated? Stress management is the recommended treatment for stress.The goals of stress management are reducing stressful life events and coping with stress  in healthy ways. Techniques for reducing stressful life events include the following:  Stress identification. Self-monitor for stress and identify what causes stress for you. These skills may help you to avoid some stressful events.  Time management. Set your priorities, keep a calendar of events, and learn to say "no." These tools can help you avoid making too many commitments.  Techniques for coping with stress include the following:  Rethinking the problem. Try to think realistically about stressful events rather than ignoring them or overreacting. Try to find the positives in a stressful situation rather than focusing on the negatives.  Exercise. Physical exercise can release both physical and  emotional tension. The key is to find a form of exercise you enjoy and do it regularly.  Relaxation techniques. These relax the body and mind. Examples include yoga, meditation, tai chi, biofeedback, deep breathing, progressive muscle relaxation, listening to music, being out in nature, journaling, and other hobbies. Again, the key is to find one or more that you enjoy and can do regularly.  Healthy lifestyle. Eat a balanced diet, get plenty of sleep, and do not smoke. Avoid using alcohol or drugs to relax.  Strong support network. Spend time with family, friends, or other people you enjoy being around.Express your feelings and talk things over with someone you trust.  Counseling or talktherapy with a mental health professional may be helpful if you are having difficulty managing stress on your own. Medicine is typically not recommended for the treatment of stress.Talk to your health care provider if you think you need medicine for symptoms of stress. Follow these instructions at home:  Keep all follow-up visits as directed by your health care provider.  Take all medicines as directed by your health care provider. Contact a health care provider if:  Your symptoms get worse or you start having new symptoms.  You feel overwhelmed by your problems and can no longer manage them on your own. Get help right away if:  You feel like hurting yourself or someone else. This information is not intended to replace advice given to you by your health care provider. Make sure you discuss any questions you have with your health care provider. Document Released: 12/26/2000 Document Revised: 12/08/2015 Document Reviewed: 02/24/2013 Elsevier Interactive Patient Education  2017 [employer].   IF you received an x-ray today, you will receive an invoice from [facility]. Please contact [facility] at [phone] with questions or concerns regarding your invoice.   IF you received labwork  today, you will receive an invoice from [facility]. Please contact [facility] at [phone] with questions or concerns regarding your invoice.   Our billing staff will not be able to assist you with questions regarding bills from these companies.  You will be contacted with the lab results as soon as they are available. The fastest way to get your results is to activate your My Chart account. Instructions are located on the last page of this paperwork. If you have not heard from [place] regarding the results in 2 weeks, please contact this office.

## 2017-12-30 NOTE — Progress Notes (Signed)
Subjective:  By signing my name below, I, Moises Blood, attest that this documentation has been prepared under the direction and in the presence of [name], MD. Electronically Signed: Moises Blood, [place]. 12/30/2017 , 2:45 PM .  Patient was seen in Room 12 .   Patient ID: Danielle Hawkins, female    DOB: [birth year], 47 y.o.   MRN: [id number] Chief Complaint  Patient presents with  . Annual Exam    CPE   HPI Danielle Hawkins is a 47 y.o. female Here for annual physical. Last saw her in Sept 2018. She has a history of HTN, hypokalemia, anemia and anxiety. Her OBGYN is Dr. Willis Modena. She is not fasting today, around an hour ago.   Anemia Lab Results  Component Value Date   WBC 8.7 03/21/2017   HGB 10.8 (L) 03/21/2017   HCT 33.9 (L) 03/21/2017   MCV 82 03/21/2017   PLT 396 (H) 03/21/2017   She is followed by OBGYN. She has a history of fibroids. Did recommended her to take iron supplement once a day, and recommended follow up in a few weeks when seen in Sept 2018.   She did take iron supplement qd initially, but had stopped because she forgot. She had blood work done at [facility] on 12/06/17 with Dr. Willis Modena, and was informed her blood level was improved.   HTN BP Readings from Last 3 Encounters:  12/30/17 122/73  03/21/17 122/79  11/05/16 (!) 142/82   Lab Results  Component Value Date   CREATININE 0.92 03/21/2017   She takes chlorthalidone 12.5 mg qd and Norvasc 5 mg qd. She reports her home BP readings doing well, around 120/70s or varies but close to that.   Anxiety Episodic xanax use, prescribed in the past with #30 on 12/03/16. She was doing well until recently. She's been taking care of her mom. She's usually on her own, so there's been some stress. She also suddenly lost a relative yesterday. She denies depression, or SI/HI. She describes feeling more anxious with the stress. She recently had 2 alcoholic drinks at a cook out, but in a typical week, very rarely does  she drink any alcohol.   Cancer Screening Mammogram: done on 12/06/17 at OBGYN, informed it was normal.  Cervical cancer screening: pap smear done with OBGYN on 12/06/17.   Immunizations Immunization History  Administered Date(s) Administered  . Tdap 08/09/2016    Depression Depression screen [facility] 2/9 12/30/2017 03/21/2017 08/09/2016 10/14/2015 01/06/2015  Decreased Interest 0 0 0 0 0  Down, Depressed, Hopeless 0 0 0 0 0  PHQ - 2 Score 0 0 0 0 0    Vision  Visual Acuity Screening   Right eye Left eye Both eyes  Without correction: 20/25-1 20/25 20/25-1  With correction:      She saw her eye doctor in the past year, Dr. Jerline Pain, about a few months ago.   Dentist She's overdue for dentist.   Exercise She's starting up exercising about 3-4 days a week, and plans on building up to more days.   There are no active problems to display for this patient.  Past Medical History:  Diagnosis Date  . Allergy   . Anemia   . Anxiety   . Blood transfusion without reported diagnosis   . Encounter for blood transfusion   . Fibroid   . 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 Hypertension   . Renal disorder    kidney stones  Past Surgical History:  Procedure Laterality Date  . GYNECOLOGIC CRYOSURGERY  early 20's  . keloid removal    . LITHOTRIPSY     Allergies  Allergen Reactions  . Diflucan [Fluconazole] Hives, Itching and Swelling  . Neosporin [Neomycin-Bacitracin Zn-Polymyx] Other (See Comments)    "burning"  . Niacin And Related Itching  . Other Hives and Itching    IV dye  . Percocet [Oxycodone-Acetaminophen] Hives and Itching  . Red Dye Hives and Itching  . Tramadol Hives and Itching  . Latex     Irration on site of contact   Prior to Admission medications   Medication Sig Start Date End Date Taking? Authorizing Provider  ALPRAZolam Duanne Moron) 0.5 MG tablet Take 1 tablet (0.5 mg total) by mouth 2 (two) times daily as needed for anxiety. 12/03/16    Wendie Agreste, MD  amLODipine (NORVASC) 5 MG tablet TAKE ONE TABLET BY MOUTH ONCE DAILY. 11/21/17   Wendie Agreste, MD  chlorthalidone (HYGROTON) 25 MG tablet Take 0.5 tablets (12.5 mg total) by mouth daily. 11/21/17   Wendie Agreste, MD  norgestimate-ethinyl estradiol (SPRINTEC 28) 0.25-35 MG-MCG tablet Take 1 tablet by mouth daily. 04/09/16   [provider]  phentermine (ADIPEX-P) 37.5 MG tablet Take 37.5 mg by mouth daily before breakfast.    [provider]  potassium chloride (K-DUR) 10 MEQ tablet Take 1 tablet (10 mEq total) by mouth daily. 03/21/17 03/24/17  Wendie Agreste, MD   Social History   Socioeconomic History  . Marital status: Single    Spouse name: Not on file  . Number of children: Not on file  . Years of education: Not on file  . Highest education level: Not on file  Occupational History  . Not on file  Social Needs  . Financial resource strain: Not on file  . Food insecurity:    Worry: Not on file    Inability: Not on file  . Transportation needs:    Medical: Not on file    Non-medical: Not on file  Tobacco Use  . Smoking status: Former Research scientist (life sciences)  . Smokeless tobacco: Never Used  Substance and Sexual Activity  . Alcohol use: Yes    Alcohol/week: 0.6 oz    Types: 1 Glasses of wine per week    Comment: Rare  . Drug use: No  . Sexual activity: Yes    Birth control/protection: Pill  Lifestyle  . Physical activity:    Days per week: Not on file    Minutes per session: Not on file  . Stress: Not on file  Relationships  . Social connections:    Talks on phone: Not on file    Gets together: Not on file    Attends religious service: Not on file    Active member of club or organization: Not on file    Attends meetings of clubs or organizations: Not on file    Relationship status: Not on file  . Intimate partner violence:    Fear of current or ex partner: Not on file    Emotionally abused: Not on file    Physically abused: Not on file      Forced sexual activity: Not on file  Other Topics Concern  . Not on file  Social History Narrative  . Not on file   Review of Systems 13 point ROS - positive for seasonal allergies and anxiety; otherwise negative     Objective:   Physical Exam  Constitutional: She is  oriented to person, place, and time. She appears well-developed and well-nourished.  HENT:  Head: Normocephalic and atraumatic.  Right Ear: External ear normal.  Left Ear: External ear normal.  Mouth/Throat: Oropharynx is clear and moist.  Eyes: Pupils are equal, round, and reactive to light. Conjunctivae are normal.  Neck: Normal range of motion. Neck supple. No thyromegaly present.  Cardiovascular: Normal rate, regular rhythm, normal heart sounds and intact distal pulses.  No murmur heard. Pulmonary/Chest: Effort normal and breath sounds normal. No respiratory distress. She has no wheezes.  Abdominal: Soft. Bowel sounds are normal. There is no tenderness.  Musculoskeletal: Normal range of motion. She exhibits no edema or tenderness.  Lymphadenopathy:    She has no cervical adenopathy.  Neurological: She is alert and oriented to person, place, and time.  Skin: Skin is warm and dry. No rash noted.  Slight thickened left 5th toenail, slightly thickened toenail with minimal discoloration in right 5th toenail  Psychiatric: She has a normal mood and affect. Her behavior is normal. Thought content normal.     Vitals:   12/30/17 1407  BP: 122/73  Pulse: 84  Temp: 98.4 F (36.9 C)  TempSrc: Oral  SpO2: 95%  Weight: 223 lb 3.2 oz (101.2 kg)  Height: _0  (1.753 m)       Assessment & Plan:    Danielle Hawkins is a 47 y.o. female Annual physical exam  - -anticipatory guidance as below in AVS, screening labs above. Health maintenance items as above in HPI discussed/recommended as applicable.   Essential hypertension - Plan: Comprehensive metabolic panel, chlorthalidone (HYGROTON) 25 MG tablet, amLODipine  (NORVASC) 5 MG tablet, DISCONTINUED: chlorthalidone (HYGROTON) 25 MG tablet, DISCONTINUED: amLODipine (NORVASC) 5 MG tablet  -Stable, tolerating current regimen.  Continue same.  Labs pending  Screening for hyperlipidemia - Plan: Comprehensive metabolic panel, Lipid panel  Anemia, unspecified type - Plan: CBC  -Check CBC for stability, previous iron deficiency anemia suspected fibroid cause.  Continue routine follow-up with OB/GYN.  Anxiety state - Plan: ALPRAZolam (XANAX) 0.5 MG tablet   Panic attacks - Plan: ALPRAZolam (XANAX) 0.5 MG tablet Situational stress  -Previous intermittent anxiety/situational anxiety with increased situational stressors recently.Handout given on stress, Xanax refilled at same dose.  Onychomycosis  -Mild symptoms on small toenails only.  Option of over-the-counter treatment initiallyok, will check LFTs as above, and consider Lamisil for 12 weeks if she would like.  Meds ordered this encounter  Medications  . DISCONTD: chlorthalidone (HYGROTON) 25 MG tablet    Sig: Take 0.5 tablets (12.5 mg total) by mouth daily.    Dispense:  30 tablet    Refill:  0    Pt. Needs to schedule an appointment for further refills.  Marland Kitchen DISCONTD: amLODipine (NORVASC) 5 MG tablet    Sig: TAKE ONE TABLET BY MOUTH ONCE DAILY.    Dispense:  30 tablet    Refill:  0    Pt. Needs to schedule an appointment for further refills.  . ALPRAZolam (XANAX) 0.5 MG tablet    Sig: Take 1 tablet (0.5 mg total) by mouth 2 (two) times daily as needed for anxiety.    Dispense:  30 tablet    Refill:  0  . chlorthalidone (HYGROTON) 25 MG tablet    Sig: Take 0.5 tablets (12.5 mg total) by mouth daily.    Dispense:  45 tablet    Refill:  2  . amLODipine (NORVASC) 5 MG tablet    Sig: TAKE ONE TABLET BY MOUTH ONCE  DAILY.    Dispense:  90 tablet    Refill:  2   Patient Instructions   See information below on stress.  I did refill Xanax for temporary use, but if you need that medicine more  frequently, please follow-up to discuss other options.  Here are some numbers for therapists in the area: Vivia Budge: [phone] Lyndee Leo Huprich: [phone]   No change in blood pressure medications for now.  I will check some blood work at your upcoming fasting lab visit.  Please try to have the blood work done in the next week.  I will also check your blood count at that time.  Areas on your toes may be early onychomycosis.  Can try over-the-counter treatments, but if other medication desired, let me know.    Fungal Nail Infection Fungal nail infection is a common fungal infection of the toenails or fingernails. This condition affects toenails more often than fingernails. More than one nail may be infected. The condition can be passed from person to person (is contagious). What are the causes? This condition is caused by a fungus. Several types of funguses can cause the infection. These funguses are common in moist and warm areas. If your hands or feet come into contact with the fungus, it may get into a crack in your fingernail or toenail and cause the infection. What increases the risk? The following factors may make you more likely to develop this condition:  Being female.  Having diabetes.  Being of older age.  Living with someone who has the fungus.  Walking barefoot in areas where the fungus thrives, such as showers or locker rooms.  Having poor circulation.  Wearing shoes and socks that cause your feet to sweat.  Having athlete's foot.  Having a nail injury or history of a recent nail surgery.  Having psoriasis.  Having a weak body defense system (immune system).  What are the signs or symptoms? Symptoms of this condition include:  A pale spot on the nail.  Thickening of the nail.  A nail that becomes yellow or brown.  A brittle or ragged nail edge.  A crumbling nail.  A nail that has lifted away from the nail bed.  How is this diagnosed? This condition is  diagnosed with a physical exam. Your health care provider may take a scraping or clipping from your nail to test for the fungus. How is this treated? Mild infections do not need treatment. If you have significant nail changes, treatment may include:  Oral antifungal medicines. You may need to take the medicine for several weeks or several months, and you may not see the results for a long time. These medicines can cause side effects. Ask your health care provider what problems to watch for.  Antifungal nail polish and nail cream. These may be used along with oral antifungal medicines.  Laser treatment of the nail.  Surgery to remove the nail. This may be needed for the most severe infections.  Treatment takes a long time, and the infection may come back. Follow these instructions at home: Medicines  Take or apply over-the-counter and prescription medicines only as told by your health care provider.  Ask your health care provider about using over-the-counter mentholated ointment on your nails. Lifestyle   Do not share personal items, such as towels or nail clippers.  Trim your nails often.  Wash and dry your hands and feet every day.  Wear absorbent socks, and change your socks frequently.  Wear shoes  that allow air to circulate, such as sandals or canvas tennis shoes. Throw out old shoes.  Wear rubber gloves if you are working with your hands in wet areas.  Do not walk barefoot in shower rooms or locker rooms.  Do not use a nail salon that does not use clean instruments.  Do not use artificial nails. General instructions  Keep all follow-up visits as told by your health care provider. This is important.  Use antifungal foot powder on your feet and in your shoes. Contact a health care provider if: Your infection is not getting better or it is getting worse after several months. This information is not intended to replace advice given to you by your health care provider.  Make sure you discuss any questions you have with your health care provider. Document Released: 06/29/2000 Document Revised: 12/08/2015 Document Reviewed: 01/03/2015 Elsevier Interactive Patient Education  2018 [place] and Stress Management Stress is a normal reaction to life events. It is what you feel when life demands more than you are used to or more than you can handle. Some stress can be useful. For example, the stress reaction can help you catch the last bus of the day, study for a test, or meet a deadline at work. But stress that occurs too often or for too long can cause problems. It can affect your emotional health and interfere with relationships and normal daily activities. Too much stress can weaken your immune system and increase your risk for physical illness. If you already have a medical problem, stress can make it worse. What are the causes? All sorts of life events may cause stress. An event that causes stress for one person may not be stressful for another person. Major life events commonly cause stress. These may be positive or negative. Examples include losing your job, moving into a new home, getting married, having a baby, or losing a loved one. Less obvious life events may also cause stress, especially if they occur day after day or in combination. Examples include working long hours, driving in traffic, caring for children, being in debt, or being in a difficult relationship. What are the signs or symptoms? Stress may cause emotional symptoms including, the following:  Anxiety. This is feeling worried, afraid, on edge, overwhelmed, or out of control.  Anger. This is feeling irritated or impatient.  Depression. This is feeling sad, down, helpless, or guilty.  Difficulty focusing, remembering, or making decisions.  Stress may cause physical symptoms, including the following:  Aches and pains. These may affect your head, neck, back, stomach, or other areas  of your body.  Tight muscles or clenched jaw.  Low energy or trouble sleeping.  Stress may cause unhealthy behaviors, including the following:  Eating to feel better (overeating) or skipping meals.  Sleeping too little, too much, or both.  Working too much or putting off tasks (procrastination).  Smoking, drinking alcohol, or using drugs to feel better.  How is this diagnosed? Stress is diagnosed through an assessment by your health care provider. Your health care provider will ask questions about your symptoms and any stressful life events.Your health care provider will also ask about your medical history and may order blood tests or other tests. Certain medical conditions and medicine can cause physical symptoms similar to stress. Mental illness can cause emotional symptoms and unhealthy behaviors similar to stress. Your health care provider may refer you to a mental health professional for further evaluation. How is this treated?  Stress management is the recommended treatment for stress.The goals of stress management are reducing stressful life events and coping with stress in healthy ways. Techniques for reducing stressful life events include the following:  Stress identification. Self-monitor for stress and identify what causes stress for you. These skills may help you to avoid some stressful events.  Time management. Set your priorities, keep a calendar of events, and learn to say "no." These tools can help you avoid making too many commitments.  Techniques for coping with stress include the following:  Rethinking the problem. Try to think realistically about stressful events rather than ignoring them or overreacting. Try to find the positives in a stressful situation rather than focusing on the negatives.  Exercise. Physical exercise can release both physical and emotional tension. The key is to find a form of exercise you enjoy and do it regularly.  Relaxation techniques.  These relax the body and mind. Examples include yoga, meditation, tai chi, biofeedback, deep breathing, progressive muscle relaxation, listening to music, being out in nature, journaling, and other hobbies. Again, the key is to find one or more that you enjoy and can do regularly.  Healthy lifestyle. Eat a balanced diet, get plenty of sleep, and do not smoke. Avoid using alcohol or drugs to relax.  Strong support network. Spend time with family, friends, or other people you enjoy being around.Express your feelings and talk things over with someone you trust.  Counseling or talktherapy with a mental health professional may be helpful if you are having difficulty managing stress on your own. Medicine is typically not recommended for the treatment of stress.Talk to your health care provider if you think you need medicine for symptoms of stress. Follow these instructions at home:  Keep all follow-up visits as directed by your health care provider.  Take all medicines as directed by your health care provider. Contact a health care provider if:  Your symptoms get worse or you start having new symptoms.  You feel overwhelmed by your problems and can no longer manage them on your own. Get help right away if:  You feel like hurting yourself or someone else. This information is not intended to replace advice given to you by your health care provider. Make sure you discuss any questions you have with your health care provider. Document Released: 12/26/2000 Document Revised: 12/08/2015 Document Reviewed: 02/24/2013 Elsevier Interactive Patient Education  2017 [employer].   IF you received an x-ray today, you will receive an invoice from [facility]. Please contact [facility] at [phone] with questions or concerns regarding your invoice.   IF you received labwork today, you will receive an invoice from [facility]. Please contact [facility] at [phone] with questions or  concerns regarding your invoice.   Our billing staff will not be able to assist you with questions regarding bills from these companies.  You will be contacted with the lab results as soon as they are available. The fastest way to get your results is to activate your My Chart account. Instructions are located on the last page of this paperwork. If you have not heard from [place] regarding the results in 2 weeks, please contact this office.       I personally performed the services described in this documentation, which was scribed in my presence. The recorded information has been reviewed and considered for accuracy and completeness, addended by me as needed, and agree with information above.  Signed,   [name], MD Primary Care at [facility].  01/01/18 2:23 PM

## 2017-12-31 ENCOUNTER — Ambulatory Visit (INDEPENDENT_AMBULATORY_CARE_PROVIDER_SITE_OTHER): Payer: 59 | Admitting: Family Medicine

## 2017-12-31 DIAGNOSIS — D649 Anemia, unspecified: Secondary | ICD-10-CM | POA: Diagnosis not present

## 2017-12-31 DIAGNOSIS — Z13220 Encounter for screening for lipoid disorders: Secondary | ICD-10-CM

## 2017-12-31 DIAGNOSIS — I10 Essential (primary) hypertension: Secondary | ICD-10-CM

## 2017-12-31 LAB — LIPID PANEL
Chol/HDL Ratio: 4.7 ratio — ABNORMAL HIGH (ref 0.0–4.4)
Cholesterol, Total: 160 mg/dL (ref 100–199)
HDL: 34 mg/dL — AB (ref >39)
LDL Calculated: 116 mg/dL — ABNORMAL HIGH (ref 0–99)
TRIGLYCERIDES: 52 mg/dL (ref 0–149)
VLDL Cholesterol Cal: 10 mg/dL (ref 5–40)

## 2017-12-31 LAB — CBC
Hematocrit: 36.1 % (ref 34.0–46.6)
Hemoglobin: 11.7 g/dL (ref 11.1–15.9)
MCH: 26.7 pg (ref 26.6–33.0)
MCHC: 32.4 g/dL (ref 31.5–35.7)
MCV: 82 fL (ref 79–97)
PLATELETS: 411 10*3/uL (ref 150–450)
RBC: 4.38 x10E6/uL (ref 3.77–5.28)
RDW: 15.2 % (ref 12.3–15.4)
WBC: 5.4 10*3/uL (ref 3.4–10.8)

## 2017-12-31 LAB — COMPREHENSIVE METABOLIC PANEL
ALBUMIN: 3.7 g/dL (ref 3.5–5.5)
ALT: 38 IU/L — ABNORMAL HIGH (ref 0–32)
AST: 17 IU/L (ref 0–40)
Albumin/Globulin Ratio: 1.4 (ref 1.2–2.2)
Alkaline Phosphatase: 50 IU/L (ref 39–117)
BILIRUBIN TOTAL: 0.2 mg/dL (ref 0.0–1.2)
BUN / CREAT RATIO: 11 (ref 9–23)
BUN: 10 mg/dL (ref 6–24)
CALCIUM: 9.1 mg/dL (ref 8.7–10.2)
CHLORIDE: 107 mmol/L — AB (ref 96–106)
CO2: 22 mmol/L (ref 20–29)
Creatinine, Ser: 0.87 mg/dL (ref 0.57–1.00)
GFR calc non Af Amer: 80 mL/min/{1.73_m2} (ref >59)
GFR, EST AFRICAN AMERICAN: 92 mL/min/{1.73_m2} (ref >59)
GLUCOSE: 78 mg/dL (ref 65–99)
Globulin, Total: 2.7 g/dL (ref 1.5–4.5)
Potassium: 3.7 mmol/L (ref 3.5–5.2)
Sodium: 142 mmol/L (ref 134–144)
TOTAL PROTEIN: 6.4 g/dL (ref 6.0–8.5)

## 2017-12-31 NOTE — Progress Notes (Signed)
Pt came for a lab only visit. Pt did not see a provider.  

## 2018-03-03 ENCOUNTER — Telehealth: Payer: Self-pay | Admitting: Family Medicine

## 2018-03-03 DIAGNOSIS — F410 Panic disorder [episodic paroxysmal anxiety] without agoraphobia: Secondary | ICD-10-CM

## 2018-03-03 DIAGNOSIS — F411 Generalized anxiety disorder: Secondary | ICD-10-CM

## 2018-03-03 NOTE — Telephone Encounter (Signed)
Patient states she keeps her medication in a 2 sided storage compact. She keeps her anxiety medication in one side and her fluid medication in the other side. Patient [place] the [place] and she had put her medication in the cooler with ice packs.She did not realize that the ice packs would leak water or that her compact was not waterproof. Her xanax got soaked and ruined- and her fluid pills got wet- but she was able to lay them out and piece them together. Patient is requesting replacement xanax if possible. Told patient I would forward her request to her provider- not sure if her xanax can be prescribed since it is a controled substance.

## 2018-03-03 NOTE — Telephone Encounter (Signed)
Copied from [place] [id number]. Topic: Quick Communication - Rx Refill/Question >> Mar 03, 2018  4:10 PM Selinda Flavin B, [place] wrote: [employer] got into pill case and messed up the pills.**  Medication: ALPRAZolam (XANAX) 0.5 MG tablet   Has the patient contacted their pharmacy? Yes.   (Agent: If no, request that the patient contact the pharmacy for the refill.) (Agent: If yes, when and what did the pharmacy advise?)  Preferred Pharmacy (with phone number or street name): [facility] [place], [place]: Please be advised that RX refills may take up to 3 business days. We ask that you follow-up with your pharmacy.

## 2018-03-04 NOTE — Telephone Encounter (Signed)
Please see below and advise. Her Xanax was last filled June,2019

## 2018-03-05 MED ORDER — ALPRAZOLAM 0.5 MG PO TABS: 0.5000 mg | ORAL_TABLET | Freq: Two times a day (BID) | ORAL | 0 refills | Status: DC | PRN

## 2018-03-05 NOTE — Telephone Encounter (Signed)
Noted.  Infrequent use previously, will authorize one-time refill given situation.  Prescription sent to [facility] in [place]

## 2018-03-05 NOTE — Telephone Encounter (Signed)
This encounter was created in error - please disregard.

## 2018-03-05 NOTE — Addendum Note (Signed)
Addended by: Addison Naegeli on: 03/05/2018 01:41 PM   Modules accepted: Level of Service, SmartSet

## 2018-03-05 NOTE — Telephone Encounter (Signed)
Charted in error; see telephone note dated 03/05/18 by Dr [name].

## 2018-08-20 ENCOUNTER — Ambulatory Visit: Payer: 59 | Admitting: Family Medicine

## 2018-10-06 ENCOUNTER — Other Ambulatory Visit: Payer: Self-pay

## 2018-10-06 ENCOUNTER — Telehealth: Payer: Self-pay | Admitting: Family Medicine

## 2018-10-06 DIAGNOSIS — I10 Essential (primary) hypertension: Secondary | ICD-10-CM

## 2018-10-06 MED ORDER — AMLODIPINE BESYLATE 5 MG PO TABS: ORAL_TABLET | ORAL | 0 refills | Status: DC

## 2018-10-06 NOTE — Telephone Encounter (Signed)
Copied from [place] [id number]. Topic: Quick Communication - Rx Refill/Question >> Oct 06, 2018 11:20 AM Scherrie Gerlach wrote: Medication: amLODipine (NORVASC) 5 MG tablet  Pt states her insurance has changed and she cannot come to this practice.  But has not found a dr yet.  Wants to know if Dr Nyoka Cowden will send in 90 day supply to get through this epidemic.  [place], [place] - [place] [phone] (Phone) [phone] (Fax)

## 2018-10-06 NOTE — Telephone Encounter (Signed)
Rx has been sent to pharmacy

## 2018-10-08 ENCOUNTER — Telehealth: Payer: Self-pay | Admitting: Family Medicine

## 2018-10-08 NOTE — Telephone Encounter (Signed)
Copied from [place] [id number]. Topic: Quick Communication - Rx Refill/Question >> Oct 08, 2018 11:10 AM Sheran Luz wrote: Medication: chlorthalidone (HYGROTON) 25 MG tablet and ALPRAZolam (XANAX) 0.5 MG tablet   Patient is requesting 90 day supply of both medications due to insurance change.   Preferred Pharmacy (with phone number or street name):[place], [place] - [place]  [phone] (Phone) [phone] (Fax)

## 2018-10-09 ENCOUNTER — Other Ambulatory Visit: Payer: Self-pay

## 2018-10-09 ENCOUNTER — Other Ambulatory Visit: Payer: Self-pay | Admitting: Family Medicine

## 2018-10-09 DIAGNOSIS — F411 Generalized anxiety disorder: Secondary | ICD-10-CM

## 2018-10-09 DIAGNOSIS — F410 Panic disorder [episodic paroxysmal anxiety] without agoraphobia: Secondary | ICD-10-CM

## 2018-10-09 NOTE — Telephone Encounter (Signed)
Controlled substance database (PDMP) reviewed. No concerns appreciated. Last filled in 03/05/18. Refilled. Schedule OV in next few months. It appears that may be with different practice based on 3/23 telephone note. Let me know if there are questions.

## 2018-10-09 NOTE — Telephone Encounter (Signed)
Patient wants a refill on alprazolam last filled by Dr Carlota Raspberry on 03/05/18 patient was last seen here 12/30/2017 Do you want to refill this until patient get in for an appt or do you want to have patient to come in to be seen or telamed

## 2018-10-09 NOTE — Telephone Encounter (Signed)
Requested medication (s) are due for refill today: Yes  Requested medication (s) are on the active medication list: Yes  Last refill:  03/05/18  Future visit scheduled: Yes  Notes to clinic:  Unable to refill, cannot delegate     Requested Prescriptions  Pending Prescriptions Disp Refills   ALPRAZolam (XANAX) 0.5 MG tablet [Pharmacy Med Name: ALPRAZolam 0.5 MG Oral Tablet] 30 tablet 0    Sig: Take 1 tablet by mouth twice daily as needed for anxiety     Not Delegated - Psychiatry:  Anxiolytics/Hypnotics Failed - 10/09/2018 12:22 PM      Failed - This refill cannot be delegated      Failed - Urine Drug Screen completed in last 360 days.      Failed - Valid encounter within last 6 months    Recent Outpatient Visits          9 months ago Anemia, unspecified type   [facility], Ranell Patrick, MD   9 months ago Annual physical exam   [facility], Ranell Patrick, MD   1 year ago Fatigue, unspecified type   [facility], Ranell Patrick, MD   2 years ago Annual physical exam   [facility], Ranell Patrick, MD   2 years ago Essential hypertension   [facility], Ranell Patrick, MD

## 2018-10-11 ENCOUNTER — Other Ambulatory Visit: Payer: Self-pay

## 2018-10-11 DIAGNOSIS — I10 Essential (primary) hypertension: Secondary | ICD-10-CM

## 2018-10-11 MED ORDER — CHLORTHALIDONE 25 MG PO TABS: 12.5000 mg | ORAL_TABLET | Freq: Every day | ORAL | 3 refills | Status: DC

## 2018-10-11 NOTE — Telephone Encounter (Signed)
RX was refilled need to follow-up for labs.

## 2019-01-21 ENCOUNTER — Ambulatory Visit: Payer: 59 | Admitting: Family Medicine

## 2019-01-23 ENCOUNTER — Encounter: Payer: Self-pay | Admitting: Family Medicine

## 2019-04-02 ENCOUNTER — Other Ambulatory Visit: Payer: Self-pay | Admitting: Family Medicine

## 2019-04-02 DIAGNOSIS — I10 Essential (primary) hypertension: Secondary | ICD-10-CM

## 2019-04-02 NOTE — Telephone Encounter (Signed)
Medication: amLODipine (NORVASC) 5 MG tablet  chlorthalidone (HYGROTON) 25 MG tablet    Patient is requesting refills. 3 mo supply if possible.     Pharmacy: [facility] [address] [place]

## 2019-04-02 NOTE — Telephone Encounter (Signed)
Requested medication (s) are due for refill today: yes  Requested medication (s) are on the active medication list: yes  Last refill: 10/06/2018  Future visit scheduled: no  Notes to clinic: review for refill   Requested Prescriptions  Pending Prescriptions Disp Refills   amLODipine (NORVASC) 5 MG tablet 90 tablet 0    Sig: TAKE ONE TABLET BY MOUTH ONCE DAILY.     Cardiovascular:  Calcium Channel Blockers Failed - 04/02/2019 10:46 AM      Failed - Valid encounter within last 6 months    Recent Outpatient Visits          1 year ago Anemia, unspecified type   [facility], Ranell Patrick, MD   1 year ago Annual physical exam   [facility], Ranell Patrick, MD   2 years ago Fatigue, unspecified type   [facility], Ranell Patrick, MD   2 years ago Annual physical exam   [facility], Ranell Patrick, MD   3 years ago Essential hypertension   [facility], Ranell Patrick, MD             Passed - Last BP in normal range    BP Readings from Last 1 Encounters:  12/30/17 122/73          chlorthalidone (HYGROTON) 25 MG tablet 45 tablet 3    Sig: Take 0.5 tablets (12.5 mg total) by mouth daily.     Cardiovascular: Diuretics - Thiazide Failed - 04/02/2019 10:46 AM      Failed - Ca in normal range and within 360 days    Calcium  Date Value Ref Range Status  12/31/2017 9.1 8.7 - 10.2 mg/dL Final         Failed - Cr in normal range and within 360 days    Creat  Date Value Ref Range Status  10/14/2015 0.92 0.50 - 1.10 mg/dL Final   Creatinine, Ser  Date Value Ref Range Status  12/31/2017 0.87 0.57 - 1.00 mg/dL Final         Failed - K in normal range and within 360 days    Potassium  Date Value Ref Range Status  12/31/2017 3.7 3.5 - 5.2 mmol/L Final         Failed - Na in normal range and within 360 days    Sodium  Date Value Ref Range Status  12/31/2017 142 134 - 144 mmol/L Final         Failed -  Valid encounter within last 6 months    Recent Outpatient Visits          1 year ago Anemia, unspecified type   [facility], Ranell Patrick, MD   1 year ago Annual physical exam   [facility], Ranell Patrick, MD   2 years ago Fatigue, unspecified type   [facility], Ranell Patrick, MD   2 years ago Annual physical exam   [facility], Ranell Patrick, MD   3 years ago Essential hypertension   [facility] [place], MD             Passed - Last BP in normal range    BP Readings from Last 1 Encounters:  12/30/17 122/73

## 2019-04-03 MED ORDER — CHLORTHALIDONE 25 MG PO TABS: 12.5000 mg | ORAL_TABLET | Freq: Every day | ORAL | 0 refills | Status: DC

## 2019-04-03 MED ORDER — AMLODIPINE BESYLATE 5 MG PO TABS: ORAL_TABLET | ORAL | 0 refills | Status: DC

## 2019-04-03 NOTE — Telephone Encounter (Signed)
LVM to schedule appt for med refills.

## 2019-04-03 NOTE — Telephone Encounter (Signed)
Refilled medication for 30 days, 0 refills. Patients needs OV for additional refills.

## 2019-12-30 LAB — HM MAMMOGRAPHY

## 2020-02-05 ENCOUNTER — Telehealth: Payer: Self-pay | Admitting: Family Medicine

## 2020-02-20 DIAGNOSIS — R05 Cough: Secondary | ICD-10-CM | POA: Diagnosis not present

## 2020-02-20 DIAGNOSIS — Z20822 Contact with and (suspected) exposure to covid-19: Secondary | ICD-10-CM | POA: Diagnosis not present

## 2020-05-17 DIAGNOSIS — R079 Chest pain, unspecified: Secondary | ICD-10-CM | POA: Diagnosis not present

## 2020-05-17 DIAGNOSIS — R059 Cough, unspecified: Secondary | ICD-10-CM | POA: Diagnosis not present

## 2020-05-17 DIAGNOSIS — R0600 Dyspnea, unspecified: Secondary | ICD-10-CM | POA: Diagnosis not present

## 2020-05-17 DIAGNOSIS — J1282 Pneumonia due to coronavirus disease 2019: Secondary | ICD-10-CM | POA: Diagnosis not present

## 2020-05-17 DIAGNOSIS — J189 Pneumonia, unspecified organism: Secondary | ICD-10-CM | POA: Diagnosis not present

## 2020-05-17 DIAGNOSIS — U071 COVID-19: Secondary | ICD-10-CM | POA: Diagnosis not present

## 2020-05-17 DIAGNOSIS — R0602 Shortness of breath: Secondary | ICD-10-CM | POA: Diagnosis not present

## 2020-05-21 DIAGNOSIS — R531 Weakness: Secondary | ICD-10-CM | POA: Diagnosis not present

## 2020-05-21 DIAGNOSIS — F419 Anxiety disorder, unspecified: Secondary | ICD-10-CM | POA: Diagnosis not present

## 2020-05-21 DIAGNOSIS — J209 Acute bronchitis, unspecified: Secondary | ICD-10-CM | POA: Diagnosis not present

## 2020-05-21 DIAGNOSIS — J90 Pleural effusion, not elsewhere classified: Secondary | ICD-10-CM | POA: Diagnosis not present

## 2020-05-21 DIAGNOSIS — I10 Essential (primary) hypertension: Secondary | ICD-10-CM | POA: Diagnosis not present

## 2020-05-21 DIAGNOSIS — J1282 Pneumonia due to coronavirus disease 2019: Secondary | ICD-10-CM | POA: Diagnosis not present

## 2020-05-21 DIAGNOSIS — J9691 Respiratory failure, unspecified with hypoxia: Secondary | ICD-10-CM | POA: Diagnosis not present

## 2020-05-21 DIAGNOSIS — R0602 Shortness of breath: Secondary | ICD-10-CM | POA: Diagnosis not present

## 2020-05-21 DIAGNOSIS — Z6834 Body mass index (BMI) 34.0-34.9, adult: Secondary | ICD-10-CM | POA: Diagnosis not present

## 2020-05-21 DIAGNOSIS — R0902 Hypoxemia: Secondary | ICD-10-CM | POA: Diagnosis not present

## 2020-05-21 DIAGNOSIS — E669 Obesity, unspecified: Secondary | ICD-10-CM | POA: Diagnosis not present

## 2020-05-21 DIAGNOSIS — F32A Depression, unspecified: Secondary | ICD-10-CM | POA: Diagnosis not present

## 2020-05-21 DIAGNOSIS — J189 Pneumonia, unspecified organism: Secondary | ICD-10-CM | POA: Diagnosis not present

## 2020-05-21 DIAGNOSIS — D649 Anemia, unspecified: Secondary | ICD-10-CM | POA: Diagnosis not present

## 2020-05-21 DIAGNOSIS — Z888 Allergy status to other drugs, medicaments and biological substances status: Secondary | ICD-10-CM | POA: Diagnosis not present

## 2020-05-21 DIAGNOSIS — U071 COVID-19: Secondary | ICD-10-CM | POA: Diagnosis not present

## 2020-05-21 DIAGNOSIS — J9601 Acute respiratory failure with hypoxia: Secondary | ICD-10-CM | POA: Diagnosis not present

## 2020-05-22 DIAGNOSIS — J9691 Respiratory failure, unspecified with hypoxia: Secondary | ICD-10-CM | POA: Diagnosis not present

## 2020-05-22 DIAGNOSIS — J209 Acute bronchitis, unspecified: Secondary | ICD-10-CM | POA: Diagnosis not present

## 2020-05-22 DIAGNOSIS — D649 Anemia, unspecified: Secondary | ICD-10-CM | POA: Diagnosis not present

## 2020-05-22 DIAGNOSIS — U071 COVID-19: Secondary | ICD-10-CM | POA: Diagnosis not present

## 2020-05-23 DIAGNOSIS — U071 COVID-19: Secondary | ICD-10-CM | POA: Diagnosis not present

## 2020-05-23 DIAGNOSIS — D649 Anemia, unspecified: Secondary | ICD-10-CM | POA: Diagnosis not present

## 2020-05-23 DIAGNOSIS — J9691 Respiratory failure, unspecified with hypoxia: Secondary | ICD-10-CM | POA: Diagnosis not present

## 2020-05-23 DIAGNOSIS — J209 Acute bronchitis, unspecified: Secondary | ICD-10-CM | POA: Diagnosis not present

## 2020-05-24 DIAGNOSIS — J9691 Respiratory failure, unspecified with hypoxia: Secondary | ICD-10-CM | POA: Diagnosis not present

## 2020-05-24 DIAGNOSIS — J209 Acute bronchitis, unspecified: Secondary | ICD-10-CM | POA: Diagnosis not present

## 2020-05-24 DIAGNOSIS — D649 Anemia, unspecified: Secondary | ICD-10-CM | POA: Diagnosis not present

## 2020-05-24 DIAGNOSIS — U071 COVID-19: Secondary | ICD-10-CM | POA: Diagnosis not present

## 2020-05-26 ENCOUNTER — Other Ambulatory Visit: Payer: Self-pay | Admitting: *Deleted

## 2020-05-26 ENCOUNTER — Encounter: Payer: Self-pay | Admitting: *Deleted

## 2020-05-26 DIAGNOSIS — U071 COVID-19: Secondary | ICD-10-CM

## 2020-05-26 DIAGNOSIS — I10 Essential (primary) hypertension: Secondary | ICD-10-CM

## 2020-05-26 NOTE — Patient Outreach (Signed)
[facility]) Care Management  05/26/2020  Danielle Hawkins [birth year] [id number]   Transition of care call/case closure   Referral received:05/24/20 Initial outreach:05/26/20 Insurance: [employer]    Subjective: Initial successful telephone call to patient's preferred number in order to complete transition of care assessment; 2 HIPAA identifiers verified. Explained purpose of call and completed transition of care assessment.  Danielle Hawkins states that she is still weak and short of breath . She report continuing to wear oxygen at 3 liters, occasional cough, no fever. Discussed having access to pulse oximeter for monitoring oxygen level she states that she has access to one , not monitoring readings. She reports having albuterol inhaler at home but has not used, reinforced use as needed. She reports slowly getting her appetite back. Patient staying at home with her mother that is able to assist in her recovery.Discussed with patient possible option of follow up with post covid center, she declines at this time she plans to arrange follow up with Dr. Carlota Raspberry. She reports declined outpatient therapy as recommended at discharge.   Reviewed accessing the following [place] Benefits : She does not have the hospital indemnity, she states that she has made contact with [facility].  She does not use a [facility] outpatient pharmacy.      Objective:  Danielle Hawkins  was hospitalized at [facility] 11/6-11/9/21 for Acute hypoxic respiratory failure , Covid 19 Pneumonia. Comorbidities include: Hypertension .  She  was discharged to home on 05/24/20 without the need for home health service and with home oxygen by [place] Patient.    Assessment:  Patient voices good understanding of all discharge instructions.  See transition of care flowsheet for assessment details.   Plan:  Reviewed hospital discharge diagnosis of Covid 19 Pneumonia   and discharge treatment plan using hospital  discharge instructions, assessing medication adherence, reviewing problems requiring provider notification, and discussing the importance of follow up with surgeon, primary care provider and/or specialists as directed.  Reviewed [place] healthy lifestyle program information to receive discounted premium for  2023   Step 1: Get  your annual physical  Step 2: Complete your health assessment  Step 3:Identify your current health status and complete the corresponding action step between January 1, and September 1, 20222.    Patient agreeable to outreach in the next week to follow up on arranging post discharge PCP visit, will  route successful outreach letter with [place] Management pamphlet and 24 Hour Nurse Line Magnet to [place] Management clinical pool to be mailed to patient's home address.    Joylene Draft, RN, BSN  [place] Management Coordinator  [phone]- Mobile [phone]- Toll Free Main Office

## 2020-06-01 ENCOUNTER — Telehealth: Payer: Self-pay | Admitting: Family Medicine

## 2020-06-01 NOTE — Telephone Encounter (Signed)
Pt called and stated she was [place] with covid on 05/10/20 and was put into the hospital on 05/21/20 and discharged on 05/24/20. Pt was put on oxygen by the hospital but they did not give her instructions on the oxygen. Pt would like a nurse to call her about this. Checking with provider to schedule pt a Hospital f/u because pt has a certain time frame to be seen before her leave is up. Please advise.

## 2020-06-02 ENCOUNTER — Other Ambulatory Visit: Payer: Self-pay | Admitting: *Deleted

## 2020-06-02 NOTE — Telephone Encounter (Signed)
Pt was scheduled for a hospital follow-up appt.

## 2020-06-02 NOTE — Patient Outreach (Signed)
[facility]) Care Management  06/02/2020  Danielle Hawkins 28-Sep-1970 [id number]   Transition of care follow up call  Referral received: 05/24/20 Initial outreach attempt: 05/26/20 Insurance: UMR     Unsuccessful telephone call to patient's preferred contact number in order to complete post hospital discharge transition of care assessment , no answer left HIPAA compliant message requesting return call.    Objective: Danielle Hawkins was hospitalized [facility] hospital 11/6-11/9/21 for Acute hypoxic respiratory failure , Covid 19 Pneumonia. Comorbidities include: Hypertension .  She  was discharged to home on 05/24/20 without the need for home health service and with home oxygen by [place] Patient.    Plan If no return call from patient will attempt 2rd outreach in the next 4 business days.   Joylene Draft, RN, BSN  [place] Management Coordinator  [phone]- Mobile [phone]- Toll Free Main Office

## 2020-06-03 ENCOUNTER — Other Ambulatory Visit: Payer: Self-pay

## 2020-06-03 ENCOUNTER — Other Ambulatory Visit: Payer: Self-pay | Admitting: Registered Nurse

## 2020-06-03 ENCOUNTER — Ambulatory Visit (INDEPENDENT_AMBULATORY_CARE_PROVIDER_SITE_OTHER): Payer: 59

## 2020-06-03 ENCOUNTER — Ambulatory Visit: Payer: 59 | Admitting: Registered Nurse

## 2020-06-03 ENCOUNTER — Encounter: Payer: Self-pay | Admitting: Registered Nurse

## 2020-06-03 ENCOUNTER — Telehealth: Payer: Self-pay | Admitting: Family Medicine

## 2020-06-03 VITALS — BP 122/86 | HR 90 | Temp 98.0°F | Resp 18 | Ht 69.0 in | Wt 229.8 lb

## 2020-06-03 DIAGNOSIS — Z9981 Dependence on supplemental oxygen: Secondary | ICD-10-CM | POA: Insufficient documentation

## 2020-06-03 DIAGNOSIS — R0600 Dyspnea, unspecified: Secondary | ICD-10-CM | POA: Diagnosis not present

## 2020-06-03 DIAGNOSIS — D649 Anemia, unspecified: Secondary | ICD-10-CM | POA: Insufficient documentation

## 2020-06-03 DIAGNOSIS — R0602 Shortness of breath: Secondary | ICD-10-CM

## 2020-06-03 DIAGNOSIS — I10 Essential (primary) hypertension: Secondary | ICD-10-CM | POA: Insufficient documentation

## 2020-06-03 DIAGNOSIS — U071 COVID-19: Secondary | ICD-10-CM

## 2020-06-03 DIAGNOSIS — D219 Benign neoplasm of connective and other soft tissue, unspecified: Secondary | ICD-10-CM | POA: Insufficient documentation

## 2020-06-03 DIAGNOSIS — R0609 Other forms of dyspnea: Secondary | ICD-10-CM

## 2020-06-03 DIAGNOSIS — R918 Other nonspecific abnormal finding of lung field: Secondary | ICD-10-CM | POA: Diagnosis not present

## 2020-06-03 DIAGNOSIS — R0789 Other chest pain: Secondary | ICD-10-CM

## 2020-06-03 DIAGNOSIS — R319 Hematuria, unspecified: Secondary | ICD-10-CM

## 2020-06-03 DIAGNOSIS — R9431 Abnormal electrocardiogram [ECG] [EKG]: Secondary | ICD-10-CM

## 2020-06-03 DIAGNOSIS — N289 Disorder of kidney and ureter, unspecified: Secondary | ICD-10-CM | POA: Insufficient documentation

## 2020-06-03 DIAGNOSIS — T7840XA Allergy, unspecified, initial encounter: Secondary | ICD-10-CM | POA: Insufficient documentation

## 2020-06-03 DIAGNOSIS — B009 Herpesviral infection, unspecified: Secondary | ICD-10-CM | POA: Insufficient documentation

## 2020-06-03 DIAGNOSIS — Z5189 Encounter for other specified aftercare: Secondary | ICD-10-CM | POA: Insufficient documentation

## 2020-06-03 DIAGNOSIS — F419 Anxiety disorder, unspecified: Secondary | ICD-10-CM | POA: Insufficient documentation

## 2020-06-03 HISTORY — DX: Dependence on supplemental oxygen: Z99.81

## 2020-06-03 HISTORY — DX: Essential (primary) hypertension: I10

## 2020-06-03 HISTORY — DX: Other forms of dyspnea: R06.09

## 2020-06-03 HISTORY — DX: Hematuria, unspecified: R31.9

## 2020-06-03 HISTORY — DX: Dyspnea, unspecified: R06.00

## 2020-06-03 LAB — D-DIMER, QUANTITATIVE: D-DIMER: 1.11 mg/L FEU — ABNORMAL HIGH (ref 0.00–0.49)

## 2020-06-03 NOTE — Patient Instructions (Signed)
° ° ° °  If you have lab work done today you will be contacted with your lab results within the next 2 weeks.  If you have not heard from us then please contact us. The fastest way to get your results is to register for My Chart. ° ° °IF you received an x-ray today, you will receive an invoice from [facility]. Please contact [facility] at [phone] with questions or concerns regarding your invoice.  ° °IF you received labwork today, you will receive an invoice from [facility]. Please contact [facility] at [phone] with questions or concerns regarding your invoice.  ° °Our billing staff will not be able to assist you with questions regarding bills from these companies. ° °You will be contacted with the lab results as soon as they are available. The fastest way to get your results is to activate your My Chart account. Instructions are located on the last page of this paperwork. If you have not heard from us regarding the results in 2 weeks, please contact this office. °  ° ° ° °

## 2020-06-03 NOTE — Progress Notes (Signed)
Elevated d-dimer  Rule out PE  Kathrin Ruddy, NP

## 2020-06-03 NOTE — Progress Notes (Signed)
Will await imaging results and cardiology follow up for further decisions here.  Thanks,  Denice Paradise

## 2020-06-03 NOTE — Telephone Encounter (Signed)
Patient is is calling wanting most recent labs  Looking for D-Dimer

## 2020-06-03 NOTE — Progress Notes (Signed)
Established Patient Office Visit  Subjective:  Patient ID: Danielle Hawkins, female    DOB: [birth year]  Age: 49 y.o. MRN: [id number]  CC:  Chief Complaint  Patient presents with  . Hospitalization Follow-up    Patient states she has covid on 10/27 and was admitted the in the hospital. PAtient wants to know how long will she be on oxygen going back to work.    HPI Danielle Hawkins presents for HFU  COVID - seen on 05/18/20 at [facility] for covid infection. Sent home with isolation and rest precautions. Presented again on 05/21/20 and was admitted until 05/24/20. Given IV antivirals, steroids, O2, and blood thinners - unsure what thinner - and discharged on 3L o2 home therapy via [place].  Ongoing symptoms include shob, particularly DOE to the point of lightheadedness, though denies LOC No chest pain - did have some previously during course, but this has improved with O2 therapy and decreased anxiety lately. Had not been on abx at any point during course of COVID Still having some chest congestion  She is supposed to go back to work next Wednesday, is hoping to be squared away by then.   Past Medical History:  Diagnosis Date  . Allergy   . Anemia   . Anxiety   . Blood transfusion without reported diagnosis   . COVID-19   . Encounter for blood transfusion   . Fibroid   . 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 Hypertension   . Renal disorder    kidney stones    Past Surgical History:  Procedure Laterality Date  . GYNECOLOGIC CRYOSURGERY  early 20's  . keloid removal    . LITHOTRIPSY      Family History  Problem Relation Age of Onset  . Hypertension Mother   . Hyperlipidemia Mother   . Diabetes Mother   . Heart disease Mother   . Hypertension Father   . Breast cancer Maternal Aunt 79  . Breast cancer Maternal Aunt 58    Social History   Socioeconomic History  . Marital status: Single    Spouse name: Not on file  . Number of children:  Not on file  . Years of education: Not on file  . Highest education level: Not on file  Occupational History  . Not on file  Tobacco Use  . Smoking status: Former Research scientist (life sciences)  . Smokeless tobacco: Never Used  Substance and Sexual Activity  . Alcohol use: Yes    Alcohol/week: 1.0 standard drink    Types: 1 Glasses of wine per week    Comment: Rare  . Drug use: No  . Sexual activity: Yes    Birth control/protection: Pill  Other Topics Concern  . Not on file  Social History Narrative  . Not on file   Social Determinants of Health   Financial Resource Strain:   . Difficulty of Paying Living Expenses: Not on file  Food Insecurity:   . Worried About Charity fundraiser in the Last Year: Not on file  . Ran Out of Food in the Last Year: Not on file  Transportation Needs:   . Lack of Transportation (Medical): Not on file  . Lack of Transportation (Non-Medical): Not on file  Physical Activity:   . Days of Exercise per Week: Not on file  . Minutes of Exercise per Session: Not on file  Stress:   . Feeling of Stress : Not on file  Social Connections:   .  Frequency of Communication with Friends and Family: Not on file  . Frequency of Social Gatherings with Friends and Family: Not on file  . Attends Religious Services: Not on file  . Active Member of Clubs or Organizations: Not on file  . Attends Archivist Meetings: Not on file  . Marital Status: Not on file  Intimate Partner Violence:   . Fear of Current or Ex-Partner: Not on file  . Emotionally Abused: Not on file  . Physically Abused: Not on file  . Sexually Abused: Not on file    Outpatient Medications Prior to Visit  Medication Sig Dispense Refill  . ALPRAZolam (XANAX) 0.5 MG tablet Take 1 tablet by mouth twice daily as needed for anxiety 30 tablet 0  . chlorthalidone (HYGROTON) 25 MG tablet Take 0.5 tablets (12.5 mg total) by mouth daily. 45 tablet 0  . Albuterol Sulfate (PROAIR RESPICLICK) 419 (90 Base) MCG/ACT  AEPB Inhale 2 puffs into the lungs every 4 (four) hours as needed. (Patient not taking: Reported on 06/03/2020)    . amLODipine (NORVASC) 5 MG tablet TAKE ONE TABLET BY MOUTH ONCE DAILY. (Patient not taking: Reported on 06/03/2020) 30 tablet 0  . benzonatate (TESSALON) 100 MG capsule Take by mouth 2 (two) times daily as needed for cough. (Patient not taking: Reported on 06/03/2020)    . dexamethasone (DECADRON) 6 MG tablet Take 6 mg by mouth daily. (Patient not taking: Reported on 06/03/2020)    . norethindrone (AYGESTIN) 5 MG tablet Take 5 mg by mouth daily. (Patient not taking: Reported on 06/03/2020)  12  . norgestimate-ethinyl estradiol (SPRINTEC 28) 0.25-35 MG-MCG tablet Take 1 tablet by mouth daily. (Patient not taking: Reported on 06/03/2020)     No facility-administered medications prior to visit.    Allergies  Allergen Reactions  . Diflucan [Fluconazole] Hives, Itching and Swelling  . Neosporin [Neomycin-Bacitracin Zn-Polymyx] Other (See Comments)    "burning"  . Niacin And Related Itching  . Other Hives and Itching    IV dye  . Percocet [Oxycodone-Acetaminophen] Hives and Itching  . Red Dye Hives and Itching  . Tramadol Hives and Itching  . Latex     Irration on site of contact    ROS Review of Systems  Constitutional: Negative.   HENT: Negative.   Eyes: Negative.   Respiratory: Positive for cough, chest tightness and shortness of breath.   Cardiovascular: Negative.   Gastrointestinal: Negative.   Genitourinary: Negative.   Musculoskeletal: Negative.   Skin: Negative.   Neurological: Negative.   Psychiatric/Behavioral: Negative.       Objective:    Physical Exam Vitals and nursing note reviewed.  Constitutional:      General: She is not in acute distress.    Appearance: Normal appearance. She is normal weight. She is not ill-appearing, toxic-appearing or diaphoretic.  Cardiovascular:     Rate and Rhythm: Normal rate and regular rhythm.     Heart sounds:  Normal heart sounds. No murmur heard.  No friction rub. No gallop.   Pulmonary:     Effort: Pulmonary effort is normal. No respiratory distress.     Breath sounds: Normal breath sounds. No stridor. No wheezing, rhonchi or rales.  Chest:     Chest wall: No tenderness.  Skin:    General: Skin is warm and dry.  Neurological:     General: No focal deficit present.     Mental Status: She is alert and oriented to person, place, and time. Mental status is at  baseline.  Psychiatric:        Mood and Affect: Mood normal.        Behavior: Behavior normal.        Thought Content: Thought content normal.        Judgment: Judgment normal.     BP 122/86   Pulse 90   Temp 98 F (36.7 C)   Resp 18   Ht 5\' 9"  (1.753 m)   Wt 229 lb 12.8 oz (104.2 kg)   SpO2 98% Comment: while on o2 machine  BMI 33.94 kg/m  Wt Readings from Last 3 Encounters:  06/03/20 229 lb 12.8 oz (104.2 kg)  12/30/17 223 lb 3.2 oz (101.2 kg)  03/21/17 220 lb (99.8 kg)     There are no preventive care reminders to display for this patient.  There are no preventive care reminders to display for this patient.  Lab Results  Component Value Date   TSH 1.390 03/21/2017   Lab Results  Component Value Date   WBC 5.4 12/31/2017   HGB 11.7 12/31/2017   HCT 36.1 12/31/2017   MCV 82 12/31/2017   PLT 411 12/31/2017   Lab Results  Component Value Date   NA 142 12/31/2017   K 3.7 12/31/2017   CO2 22 12/31/2017   GLUCOSE 78 12/31/2017   BUN 10 12/31/2017   CREATININE 0.87 12/31/2017   BILITOT 0.2 12/31/2017   ALKPHOS 50 12/31/2017   AST 17 12/31/2017   ALT 38 (H) 12/31/2017   PROT 6.4 12/31/2017   ALBUMIN 3.7 12/31/2017   CALCIUM 9.1 12/31/2017   ANIONGAP 10 11/05/2016   Lab Results  Component Value Date   CHOL 160 12/31/2017   Lab Results  Component Value Date   HDL 34 (L) 12/31/2017   Lab Results  Component Value Date   LDLCALC 116 (H) 12/31/2017   Lab Results  Component Value Date   TRIG 52  12/31/2017   Lab Results  Component Value Date   CHOLHDL 4.7 (H) 12/31/2017   No results found for: HGBA1C    Assessment & Plan:   Problem List Items Addressed This Visit      Other   COVID-19 - Primary   Relevant Orders   DG Chest 2 View (Completed)   EKG 12-Lead (Completed)   D-dimer, quantitative (not at [facility])   Comprehensive metabolic panel   CBC   Ambulatory referral to Cardiology   On home oxygen therapy   Relevant Orders   DG Chest 2 View (Completed)   EKG 12-Lead (Completed)   Dyspnea on exertion   Relevant Orders   DG Chest 2 View (Completed)   EKG 12-Lead (Completed)   D-dimer, quantitative (not at [facility])   Comprehensive metabolic panel   CBC   Ambulatory referral to Cardiology    Other Visit Diagnoses    Shortness of breath       Relevant Orders   DG Chest 2 View (Completed)   EKG 12-Lead (Completed)   D-dimer, quantitative (not at [facility])   Comprehensive metabolic panel   CBC   T wave inversion in EKG       Relevant Orders   Ambulatory referral to Cardiology      No orders of the defined types were placed in this encounter.   Follow-up: No follow-ups on file.   PLAN  DG chest two view shows significant improvement. Some atelectasis or scarring. No acute abnormalities or focal infiltrates  EKG shows new T changes from previous on  03/21/17 - inversions in leads III, aVF, V6, V5, and V4. Flattened T waves in II and V4. Concern for PE given her ongoing symptoms. Will draw stat D-dimer though may be elevated regardless given recent COVID-19 infection. Will discuss case with cardiologist on call - close follow up with cardiology. Pending D-dimer result will consider stat chest CT r/o PE.  Continue home o2  Reviewed red flags with patient and reasons to present to ED. Patient demonstrates understanding.   Patient encouraged to call clinic with any questions, comments, or concerns.  Maximiano Coss, NP

## 2020-06-03 NOTE — Progress Notes (Signed)
If we could call patient -   D-dimer is borderline. I would prefer to get CT angiogram done. I have sent the order to [facility] but am looking to see if there's something in [place]. If there is, I'll let her know  Thank you  Kathrin Ruddy, NP

## 2020-06-06 ENCOUNTER — Telehealth: Payer: Self-pay

## 2020-06-06 ENCOUNTER — Encounter: Payer: Self-pay | Admitting: Cardiology

## 2020-06-06 ENCOUNTER — Other Ambulatory Visit: Payer: Self-pay

## 2020-06-06 ENCOUNTER — Ambulatory Visit (INDEPENDENT_AMBULATORY_CARE_PROVIDER_SITE_OTHER): Payer: 59 | Admitting: Cardiology

## 2020-06-06 ENCOUNTER — Encounter: Payer: Self-pay | Admitting: Nurse Practitioner

## 2020-06-06 ENCOUNTER — Other Ambulatory Visit: Payer: Self-pay | Admitting: Family Medicine

## 2020-06-06 VITALS — BP 114/79 | HR 97 | Ht 69.0 in | Wt 230.2 lb

## 2020-06-06 DIAGNOSIS — Z9981 Dependence on supplemental oxygen: Secondary | ICD-10-CM | POA: Diagnosis not present

## 2020-06-06 DIAGNOSIS — U071 COVID-19: Secondary | ICD-10-CM

## 2020-06-06 DIAGNOSIS — F411 Generalized anxiety disorder: Secondary | ICD-10-CM

## 2020-06-06 DIAGNOSIS — R0609 Other forms of dyspnea: Secondary | ICD-10-CM

## 2020-06-06 DIAGNOSIS — I10 Essential (primary) hypertension: Secondary | ICD-10-CM | POA: Diagnosis not present

## 2020-06-06 DIAGNOSIS — F410 Panic disorder [episodic paroxysmal anxiety] without agoraphobia: Secondary | ICD-10-CM

## 2020-06-06 DIAGNOSIS — R0600 Dyspnea, unspecified: Secondary | ICD-10-CM

## 2020-06-06 DIAGNOSIS — D649 Anemia, unspecified: Secondary | ICD-10-CM | POA: Diagnosis not present

## 2020-06-06 DIAGNOSIS — R778 Other specified abnormalities of plasma proteins: Secondary | ICD-10-CM | POA: Diagnosis not present

## 2020-06-06 DIAGNOSIS — R0602 Shortness of breath: Secondary | ICD-10-CM | POA: Diagnosis not present

## 2020-06-06 DIAGNOSIS — U099 Post covid-19 condition, unspecified: Secondary | ICD-10-CM | POA: Diagnosis not present

## 2020-06-06 NOTE — Telephone Encounter (Signed)
Pt. Called inquiring if Mr. Danielle Hawkins had decided on wether or not to take her out of work for the next week. Pt. Also requested the provider review the prescription of oxygen, make adjustments if needed and asked an oxygen conserving device be perscribed and faxed too [phone].

## 2020-06-06 NOTE — Telephone Encounter (Signed)
Pt also stated theta she would like Referrals to know that for the CT scan she is allergic to dye. Please advise.

## 2020-06-06 NOTE — Telephone Encounter (Signed)
Called and spoke to pt she voiced understanding having forms sent over, would like them filled out this will be in place of the note, pt also understanding of Oxygen request

## 2020-06-06 NOTE — Progress Notes (Signed)
Cardiology Office Note:    Date:  06/06/2020   ID:  Danielle Hawkins, DOB 04-Jul-1971, MRN [id number]  PCP:  Wendie Agreste, MD  Cardiologist:  Jenean Lindau, MD   Referring MD: Maximiano Coss, NP    ASSESSMENT:    1. Essential hypertension   2. COVID-19   3. On home oxygen therapy   4. Dyspnea on exertion    PLAN:    In order of problems listed above:  1. Primary prevention stressed with the patient.  Importance of compliance with diet medication stressed and she vocalized understanding. 2. Dyspnea on exertion: This is of concern to me especially in view of the fact that she has had Covid pneumonitis.  Her D-dimer is significantly elevated and she was supposed to have a CT scan with contrast.  I am not sure of the status of it.  It concerns me and in view of her history and symptoms I would like for her to be sent to the emergency room for CT coronary angiography with contrast to rule out pulmonary thromboembolism. 3. Further recommendations will be made based on the findings of the aforementioned test.  I think this is very important I will review those reports.  Patient will also eventually be scheduled for a echocardiogram.  She will be seen in follow-up appointment in a month or earlier if she has any concerns.  She had multiple questions which were answered to her questions satisfaction. 4. Patient is on oxygen therapy post COVID-19 pneumonitis and might benefit from pulmonary referral.  She requested and I referred her to primary care provider.   Medication Adjustments/Labs and Tests Ordered: Current medicines are reviewed at length with the patient today.  Concerns regarding medicines are outlined above.  No orders of the defined types were placed in this encounter.  No orders of the defined types were placed in this encounter.    History of Present Illness:    Danielle Hawkins is a 49 y.o. female who is being seen today for the evaluation of dyspnea on exertion post  Covid pneumonitis at the request of Maximiano Coss, NP.  Patient is a pleasant 49 year old female.  She has past medical history of essential hypertension.  She had COVID-19 pneumonia and is recovering.  She saw her primary care provider.  Primary care provider did blood work and also D-dimer.  D-dimer is elevated.  I am not sure about her follow-up for CT of the chest with contrast.  Patient mentions to me that it is to be done and being scheduled.  At the time of my evaluation she is alert awake oriented and in no distress.  She is on oxygen 24 hours post Covid pneumonitis and is recovering.  She appears well but clinically she appears to be recovering from her Covid pneumonitis.  She is not back to her normal self and health.  Past Medical History:  Diagnosis Date  . Acute non-recurrent sinusitis 06/04/2016  . Allergy   . Anemia   . Anxiety   . Blood in urine 06/03/2020  . Blood transfusion without reported diagnosis   . COVID-19   . Dyspnea on exertion 06/03/2020  . Encounter for blood transfusion   . Essential hypertension 06/04/2016  . Fibroid   . 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 Hypertension   . Hypertensive disorder 06/03/2020  . On home oxygen therapy 06/03/2020  . Renal disorder    kidney stones    Past Surgical  History:  Procedure Laterality Date  . GYNECOLOGIC CRYOSURGERY  early 20's  . keloid removal    . LITHOTRIPSY      Current Medications: Current Meds  Medication Sig  . ALPRAZolam (XANAX) 0.5 MG tablet Take 1 tablet by mouth twice daily as needed for anxiety  . chlorthalidone (HYGROTON) 25 MG tablet Take 0.5 tablets (12.5 mg total) by mouth daily.     Allergies:   Benzalkonium chloride, Diflucan [fluconazole], Iopamidol, Neosporin [neomycin-bacitracin zn-polymyx], Niacin and related, Other, Oxycodone-acetaminophen, Percocet [oxycodone-acetaminophen], Red dye, Tramadol, Niacin, Iodinated diagnostic agents, and Latex   Social History    Socioeconomic History  . Marital status: Single    Spouse name: Not on file  . Number of children: Not on file  . Years of education: Not on file  . Highest education level: Not on file  Occupational History  . Not on file  Tobacco Use  . Smoking status: Former Research scientist (life sciences)  . Smokeless tobacco: Never Used  Substance and Sexual Activity  . Alcohol use: Yes    Alcohol/week: 1.0 standard drink    Types: 1 Glasses of wine per week    Comment: Rare  . Drug use: No  . Sexual activity: Yes    Birth control/protection: Pill  Other Topics Concern  . Not on file  Social History Narrative  . Not on file   Social Determinants of Health   Financial Resource Strain:   . Difficulty of Paying Living Expenses: Not on file  Food Insecurity:   . Worried About Charity fundraiser in the Last Year: Not on file  . Ran Out of Food in the Last Year: Not on file  Transportation Needs:   . Lack of Transportation (Medical): Not on file  . Lack of Transportation (Non-Medical): Not on file  Physical Activity:   . Days of Exercise per Week: Not on file  . Minutes of Exercise per Session: Not on file  Stress:   . Feeling of Stress : Not on file  Social Connections:   . Frequency of Communication with Friends and Family: Not on file  . Frequency of Social Gatherings with Friends and Family: Not on file  . Attends Religious Services: Not on file  . Active Member of Clubs or Organizations: Not on file  . Attends Archivist Meetings: Not on file  . Marital Status: Not on file     Family History: The patient's family history includes Breast cancer (age of onset: 69) in her maternal aunt and maternal aunt; Diabetes in her mother; Heart disease in her mother; Hyperlipidemia in her mother; Hypertension in her father and mother.  ROS:   Please see the history of present illness.    All other systems reviewed and are negative.  EKGs/Labs/Other Studies Reviewed:    The following studies were  reviewed today: EKG reveals sinus rhythm with nonspecific ST-T changes.   Recent Labs: 06/03/2020: ALT 33; BUN 11; Creatinine, Ser 0.70; Hemoglobin 10.0; Platelets 256; Potassium 3.9; Sodium 138  Recent Lipid Panel    Component Value Date/Time   CHOL 160 12/31/2017 0829   TRIG 52 12/31/2017 0829   HDL 34 (L) 12/31/2017 0829   CHOLHDL 4.7 (H) 12/31/2017 0829   LDLCALC 116 (H) 12/31/2017 0829    Physical Exam:    VS:  BP 114/79   Pulse 97   Ht 5\' 9"  (1.753 m)   Wt 230 lb 3.2 oz (104.4 kg)   SpO2 97%   BMI 33.99 kg/m  Wt Readings from Last 3 Encounters:  06/06/20 230 lb 3.2 oz (104.4 kg)  06/03/20 229 lb 12.8 oz (104.2 kg)  12/30/17 223 lb 3.2 oz (101.2 kg)     GEN: Patient is in no acute distress HEENT: Normal NECK: No JVD; No carotid bruits LYMPHATICS: No lymphadenopathy CARDIAC: S1 S2 regular, 2/6 systolic murmur at the apex. RESPIRATORY:  Clear to auscultation without rales, wheezing or rhonchi  ABDOMEN: Soft, non-tender, non-distended MUSCULOSKELETAL:  No edema; No deformity  SKIN: Warm and dry NEUROLOGIC:  Alert and oriented x 3 PSYCHIATRIC:  Normal affect    Signed, Jenean Lindau, MD  06/06/2020 4:55 PM    [facility]

## 2020-06-06 NOTE — Telephone Encounter (Signed)
Forms received and placed in [facility] provider box

## 2020-06-06 NOTE — Telephone Encounter (Signed)
Requested medication (s) are due for refill today: yes  Requested medication (s) are on the active medication list: yes  Last refill: 10/09/18  #30  0 refills  Future visit scheduled: No  Notes to clinic:  Not delegated    Requested Prescriptions  Pending Prescriptions Disp Refills   ALPRAZolam (XANAX) 0.5 MG tablet [Pharmacy Med Name: ALPRAZolam 0.5 MG Oral Tablet] 30 tablet 0    Sig: Take 1 tablet by mouth twice daily as needed for anxiety      Not Delegated - Psychiatry:  Anxiolytics/Hypnotics Failed - 06/06/2020  2:16 PM      Failed - This refill cannot be delegated      Failed - Urine Drug Screen completed in last 360 days      Passed - Valid encounter within last 6 months    Recent Outpatient Visits           3 days ago COVID-19   [facility], NP   2 years ago Anemia, unspecified type   [facility], Ranell Patrick, MD   2 years ago Annual physical exam   [facility], MD   3 years ago Fatigue, unspecified type   [facility], Ranell Patrick, MD   3 years ago Annual physical exam   [facility], Ranell Patrick, MD       Future Appointments             Today Revankar, Reita Cliche, MD [place] [facility]

## 2020-06-06 NOTE — Telephone Encounter (Signed)
Ok to write a work note to excuse her for this week Oxygen - I would prefer review after she sees cardiology today  Thank you  Kathrin Ruddy, NP

## 2020-06-06 NOTE — Patient Instructions (Signed)

## 2020-06-06 NOTE — Telephone Encounter (Signed)
Patient is requesting a refill of the following medications: Requested Prescriptions   Pending Prescriptions Disp Refills   ALPRAZolam (XANAX) 0.5 MG tablet [Pharmacy Med Name: ALPRAZolam 0.5 MG Oral Tablet] 30 tablet 0    Sig: Take 1 tablet by mouth twice daily as needed for anxiety    Date of patient request: 06/06/20 Last office visit: 06/03/20 Date of last refill: 10/09/18 Last refill amount: 30 +0 Follow up time period per chart: Not yet

## 2020-06-06 NOTE — Telephone Encounter (Signed)
Pt requests regarding Oxygen and work leave please advise

## 2020-06-06 NOTE — Telephone Encounter (Signed)
Pt called stated she will need more paperwork filled out. Pt stated that she will come by the office to drop that off. Pt also stated that she is going to need a Rx for conserving devise for her oxygen faxed off to   Pharmacy is also faxing off a refill request for her bp, xanax, and other medications that she saw NP Orland Mustard for  On 11/19   Fax number for oxygen is to [facility] Patient [phone].  Please advise.

## 2020-06-06 NOTE — Telephone Encounter (Signed)
Pt called office stating she had an appt with Mr.Morrow last Friday. A D-dimer was ordered along with a Stat CT. Pt went to cardiology today the CT hadn't been done, and pt was told she should have had this done already pt is being sent to the ER tonight for the CT. Pt want her Provider Dr.Greene to follow-up with her tomorrow. Pt is up set and really wants her provider to reach out to her. I did my best to mend the situation, but the pt wants a call from the provider.

## 2020-06-07 ENCOUNTER — Other Ambulatory Visit: Payer: Self-pay | Admitting: *Deleted

## 2020-06-07 MED ORDER — CHLORTHALIDONE 25 MG PO TABS: 12.5000 mg | ORAL_TABLET | Freq: Every day | ORAL | 0 refills | Status: DC

## 2020-06-07 NOTE — Patient Outreach (Signed)
[facility]) Care Management  06/07/2020  Danielle Hawkins 15-Jan-1971 [id number]   Transition of care follow up call/case closure  Referral received: 05/24/20 Initial outreach attempt: 05/26/20 Insurance: UMR    Subjective: Successful outreach call to patient, she reports feeling much better than when initially discharged.  Patient discussed recent visit with PCP and cardiology, follow up CT scan on last evening, no blood clot. She discussed possible plans for referral to lung doctor. She denies having fever, has occasional cough, she reports using incentive and spirometry throughout the day, reviewed benefit of use to help with taking deep breath exercises.  She continues to use oxygen at 3 liters  she reports being advised by MD to use albuterol inhaler at least twice daily to help with lung air flow. She discussed reading of oxygen levels on room air at rest at cardiology visit in normal range on yesterday,but states she gets short of breath when walking,not tested with activity.  Discussed use of pulse oximeter to measure oxygen saturation at home she states that they plan to purchase one, and has been instructed by PCP to monitor readings at home with oxygen and while up walking , she discussed plans for follow up with provider again in the next 2 weeks regarding progress with weaning off oxygen , reviewed normal ranges of oxygen level and notifying MD of concerns for low levels. Reinforced continued use of incentive spirometry and flutter valve.  Patient denies having fever, appetite and intake is good.   Reviewed [place] benefits: Patient does not use a [place] pharmacy is asked question regarding [facility] pharmacy in [place] area where she lives. Discussed [facility] that does mail order, provider contact number.     Objective [facility] for Acute hypoxic respiratory failure , Covid 19  Pneumonia.Comorbidities include:Hypertension . Shewas discharged to home on 11/9/21without the need for home health serviceand with home [facility] Patient.   Plan No ongoing care management needs identified, Will plan case closure patient agreeable.    Joylene Draft, RN, BSN  [facility] Management Coordinator  [phone]- Mobile [phone]- Toll Free Main Office

## 2020-06-07 NOTE — Telephone Encounter (Signed)
Pt is checking on status of this message, would really like a cb at [phone]. She is being called by [facility]  for an appointment for a scan, but she had [facility] scan last night at [facility].

## 2020-06-07 NOTE — Telephone Encounter (Signed)
Spoke with patient earlier this morning.  Reportedly had a VQ scan without sign of PE yesterday.  I suspect the call today was for the previously ordered CT angiogram.  Should not need that study at this time with previous testing.  Please obtain records from [facility] so we can review those notes.

## 2020-06-07 NOTE — Telephone Encounter (Signed)
LVMTCB to sch this 1 week f/u for Dyspnea with NP Orland Mustard.

## 2020-06-07 NOTE — Telephone Encounter (Signed)
Needs refill of xanax and chlorthalidone.  insurance had changed and had not been able to follow up with me. Plans to follow up with me, will refill meds temporarily. Some flair of anxiety with recent illness. Controlled substance database (PDMP) reviewed. No concerns appreciated. Last filled in 2020. Also refilled chlorthalidone.  Also discussed concern about recent DDimer and plan. Stat CT ordered on 11/19.   Had VQ scan in [facility] ER yesterday, no sign of blood clot. Atelectasis on CXR, scarring? On CXR. Using inhaler BID and breathing discussed with ER provider.   Follow up visit with Rich in office in next  week. Also recommended calling cardiology to determine timing of their follow up.  Using 3 liters oxygen. No home O2sat, but will get one. Can discuss tapering at in office follow up.  Will need adjusted paperwork for return to work. Will advise Rich.

## 2020-06-08 ENCOUNTER — Telehealth (INDEPENDENT_AMBULATORY_CARE_PROVIDER_SITE_OTHER): Payer: 59 | Admitting: Registered Nurse

## 2020-06-08 ENCOUNTER — Other Ambulatory Visit: Payer: Self-pay

## 2020-06-08 DIAGNOSIS — U071 COVID-19: Secondary | ICD-10-CM

## 2020-06-08 NOTE — Patient Instructions (Signed)
° ° ° °  If you have lab work done today you will be contacted with your lab results within the next 2 weeks.  If you have not heard from us then please contact us. The fastest way to get your results is to register for My Chart. ° ° °IF you received an x-ray today, you will receive an invoice from [facility]. Please contact [facility] at [phone] with questions or concerns regarding your invoice.  ° °IF you received labwork today, you will receive an invoice from [facility]. Please contact [facility] at [phone] with questions or concerns regarding your invoice.  ° °Our billing staff will not be able to assist you with questions regarding bills from these companies. ° °You will be contacted with the lab results as soon as they are available. The fastest way to get your results is to activate your My Chart account. Instructions are located on the last page of this paperwork. If you have not heard from us regarding the results in 2 weeks, please contact this office. °  ° ° ° °

## 2020-06-08 NOTE — Telephone Encounter (Signed)
Forms filled out signed and faxed.

## 2020-06-13 ENCOUNTER — Other Ambulatory Visit: Payer: Self-pay

## 2020-06-13 ENCOUNTER — Ambulatory Visit: Payer: 59 | Admitting: Registered Nurse

## 2020-06-13 ENCOUNTER — Encounter: Payer: Self-pay | Admitting: Registered Nurse

## 2020-06-13 ENCOUNTER — Telehealth: Payer: Self-pay

## 2020-06-13 VITALS — BP 117/80 | HR 94 | Temp 97.8°F | Resp 18 | Ht 69.0 in | Wt 230.3 lb

## 2020-06-13 DIAGNOSIS — U071 COVID-19: Secondary | ICD-10-CM

## 2020-06-13 DIAGNOSIS — R5383 Other fatigue: Secondary | ICD-10-CM | POA: Diagnosis not present

## 2020-06-13 DIAGNOSIS — R0602 Shortness of breath: Secondary | ICD-10-CM

## 2020-06-13 LAB — CBC
Hematocrit: 31.6 % — ABNORMAL LOW (ref 34.0–46.6)
Hemoglobin: 10 g/dL — ABNORMAL LOW (ref 11.1–15.9)
MCH: 25.6 pg — ABNORMAL LOW (ref 26.6–33.0)
MCHC: 31.6 g/dL (ref 31.5–35.7)
MCV: 81 fL (ref 79–97)
Platelets: 256 10*3/uL (ref 150–450)
RBC: 3.91 x10E6/uL (ref 3.77–5.28)
RDW: 13.9 % (ref 11.7–15.4)
WBC: 8.7 10*3/uL (ref 3.4–10.8)

## 2020-06-13 LAB — COMPREHENSIVE METABOLIC PANEL
ALT: 33 IU/L — ABNORMAL HIGH (ref 0–32)
AST: 31 IU/L (ref 0–40)
Albumin/Globulin Ratio: 1.3 (ref 1.2–2.2)
Albumin: 3.1 g/dL — ABNORMAL LOW (ref 3.8–4.8)
Alkaline Phosphatase: 60 IU/L (ref 44–121)
BUN/Creatinine Ratio: 16 (ref 9–23)
BUN: 11 mg/dL (ref 6–24)
Bilirubin Total: 0.5 mg/dL (ref 0.0–1.2)
CO2: 26 mmol/L (ref 20–29)
Calcium: 9 mg/dL (ref 8.7–10.2)
Chloride: 101 mmol/L (ref 96–106)
Creatinine, Ser: 0.7 mg/dL (ref 0.57–1.00)
GFR calc Af Amer: 118 mL/min/{1.73_m2} (ref >59)
GFR calc non Af Amer: 102 mL/min/{1.73_m2} (ref >59)
Globulin, Total: 2.4 g/dL (ref 1.5–4.5)
Glucose: 102 mg/dL — ABNORMAL HIGH (ref 65–99)
Potassium: 3.9 mmol/L (ref 3.5–5.2)
Sodium: 138 mmol/L (ref 134–144)
Total Protein: 5.5 g/dL — ABNORMAL LOW (ref 6.0–8.5)

## 2020-06-13 LAB — D-DIMER, QUANTITATIVE

## 2020-06-13 MED ORDER — BREO ELLIPTA 200-25 MCG/INH IN AEPB: 1.0000 | INHALATION_SPRAY | Freq: Two times a day (BID) | RESPIRATORY_TRACT | 2 refills | Status: DC

## 2020-06-13 MED ORDER — BREO ELLIPTA 200-25 MCG/INH IN AEPB: 1.0000 | INHALATION_SPRAY | Freq: Every day | RESPIRATORY_TRACT | 2 refills | Status: DC

## 2020-06-13 NOTE — Telephone Encounter (Signed)
Received form from [employer] from [place] that was completed for pt that came into the office today.  Forms have been faxed with confirmation and I have placed a copy in the brown box upfront. The original has been placed in [place] area to be filed.   Thanks.

## 2020-06-13 NOTE — Progress Notes (Signed)
Established Patient Office Visit  Subjective:  Patient ID: Danielle Hawkins, female    DOB: Feb 16, 1971  Age: 49 y.o. MRN: [id number]  CC:  Chief Complaint  Patient presents with   Follow-up    1 week follow and also discuss when she is rreturning to work.    HPI Danielle Hawkins presents for covid follow up No significant improvement since last week Has been on albuterol regularly, still on 3L o2 via [place] shob very easily, still doe Shireen Quan is a little better but nowhere near ready to return to work We will extend leave  She is interested in seeing a pulmonologist about this  Past Medical History:  Diagnosis Date   Acute non-recurrent sinusitis 06/04/2016   Allergy    Anemia    Anxiety    Blood in urine 06/03/2020   Blood transfusion without reported diagnosis    COVID-19    Dyspnea on exertion 06/03/2020   Encounter for blood transfusion    Essential hypertension 06/04/2016   Fibroid    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   Hypertension    Hypertensive disorder 06/03/2020   On home oxygen therapy 06/03/2020   Renal disorder    kidney stones    Past Surgical History:  Procedure Laterality Date   GYNECOLOGIC CRYOSURGERY  early 20's   keloid removal     LITHOTRIPSY      Family History  Problem Relation Age of Onset   Hypertension Mother    Hyperlipidemia Mother    Diabetes Mother    Heart disease Mother    Hypertension Father    Breast cancer Maternal Aunt 63   Breast cancer Maternal Aunt 2    Social History   Socioeconomic History   Marital status: Single    Spouse name: Not on file   Number of children: Not on file   Years of education: Not on file   Highest education level: Not on file  Occupational History   Not on file  Tobacco Use   Smoking status: Former Smoker   Smokeless tobacco: Never Used  Substance and Sexual Activity   Alcohol use: Yes    Alcohol/week: 1.0 standard drink     Types: 1 Glasses of wine per week    Comment: Rare   Drug use: No   Sexual activity: Yes    Birth control/protection: Pill  Other Topics Concern   Not on file  Social History Narrative   Not on file   Social Determinants of Health   Financial Resource Strain:    Difficulty of Paying Living Expenses: Not on file  Food Insecurity:    Worried About Charity fundraiser in the Last Year: Not on file   [employer] of Food in the Last Year: Not on file  Transportation Needs:    Lack of Transportation (Medical): Not on file   Lack of Transportation (Non-Medical): Not on file  Physical Activity:    Days of Exercise per Week: Not on file   Minutes of Exercise per Session: Not on file  Stress:    Feeling of Stress : Not on file  Social Connections:    Frequency of Communication with Friends and Family: Not on file   Frequency of Social Gatherings with Friends and Family: Not on file   Attends Religious Services: Not on file   Active Member of Clubs or Organizations: Not on file   Attends Archivist Meetings: Not on file  Marital Status: Not on file  Intimate Partner Violence:    Fear of Current or Ex-Partner: Not on file   Emotionally Abused: Not on file   Physically Abused: Not on file   Sexually Abused: Not on file    Outpatient Medications Prior to Visit  Medication Sig Dispense Refill   ALPRAZolam (XANAX) 0.5 MG tablet Take 1 tablet by mouth twice daily as needed for anxiety 30 tablet 0   chlorthalidone (HYGROTON) 25 MG tablet Take 0.5 tablets (12.5 mg total) by mouth daily. 45 tablet 0   No facility-administered medications prior to visit.    Allergies  Allergen Reactions   Benzalkonium Chloride Other (See Comments)    Burning   Diflucan [Fluconazole] Hives, Itching and Swelling   Iopamidol Itching and Swelling   Neosporin [Neomycin-Bacitracin Zn-Polymyx] Other (See Comments)    "burning"   Niacin And Related Itching   Other  Hives, Itching and Other (See Comments)    IV dye Allergic to metal. Burning reaction    Oxycodone-Acetaminophen Itching   Percocet [Oxycodone-Acetaminophen] Hives and Itching   Red Dye Hives and Itching   Tramadol Hives and Itching   Niacin Itching   Iodinated Diagnostic Agents Itching and Hives   Latex Itching    Irration on site of contact    ROS Review of Systems  Constitutional: Positive for fatigue.  HENT: Negative.   Eyes: Negative.   Respiratory: Positive for cough, shortness of breath and wheezing.   Cardiovascular: Negative.   Gastrointestinal: Negative.   Genitourinary: Negative.   Musculoskeletal: Negative.   Skin: Negative.   Neurological: Negative.   Psychiatric/Behavioral: Negative.   All other systems reviewed and are negative.     Objective:    Physical Exam Vitals and nursing note reviewed.  Constitutional:      General: She is not in acute distress.    Appearance: Normal appearance. She is normal weight. She is not ill-appearing, toxic-appearing or diaphoretic.  Cardiovascular:     Rate and Rhythm: Normal rate and regular rhythm.     Heart sounds: Normal heart sounds. No murmur heard.  No friction rub. No gallop.   Pulmonary:     Effort: Pulmonary effort is normal. No respiratory distress.     Breath sounds: Normal breath sounds. No stridor. No wheezing, rhonchi or rales.  Chest:     Chest wall: No tenderness.  Skin:    General: Skin is warm and dry.  Neurological:     General: No focal deficit present.     Mental Status: She is alert and oriented to person, place, and time. Mental status is at baseline.  Psychiatric:        Mood and Affect: Mood normal.        Behavior: Behavior normal.        Thought Content: Thought content normal.        Judgment: Judgment normal.     BP 117/80    Pulse 94    Temp 97.8 F (36.6 C) (Temporal)    Resp 18    Ht 5\' 9"  (1.753 m)    Wt 230 lb 4.8 oz (104.5 kg)    SpO2 98%    BMI 34.01 kg/m  Wt  Readings from Last 3 Encounters:  06/13/20 230 lb 4.8 oz (104.5 kg)  06/06/20 230 lb 3.2 oz (104.4 kg)  06/03/20 229 lb 12.8 oz (104.2 kg)     There are no preventive care reminders to display for this patient.  There are  no preventive care reminders to display for this patient.  Lab Results  Component Value Date   TSH 1.390 03/21/2017   Lab Results  Component Value Date   WBC 8.7 06/03/2020   HGB 10.0 (L) 06/03/2020   HCT 31.6 (L) 06/03/2020   MCV 81 06/03/2020   PLT 256 06/03/2020   Lab Results  Component Value Date   NA 138 06/03/2020   K 3.9 06/03/2020   CO2 26 06/03/2020   GLUCOSE 102 (H) 06/03/2020   BUN 11 06/03/2020   CREATININE 0.70 06/03/2020   BILITOT 0.5 06/03/2020   ALKPHOS 60 06/03/2020   AST 31 06/03/2020   ALT 33 (H) 06/03/2020   PROT 5.5 (L) 06/03/2020   ALBUMIN 3.1 (L) 06/03/2020   CALCIUM 9.0 06/03/2020   ANIONGAP 10 11/05/2016   Lab Results  Component Value Date   CHOL 160 12/31/2017   Lab Results  Component Value Date   HDL 34 (L) 12/31/2017   Lab Results  Component Value Date   LDLCALC 116 (H) 12/31/2017   Lab Results  Component Value Date   TRIG 52 12/31/2017   Lab Results  Component Value Date   CHOLHDL 4.7 (H) 12/31/2017   No results found for: HGBA1C    Assessment & Plan:   Problem List Items Addressed This Visit    None    Visit Diagnoses    Fatigue, unspecified type    -  Primary   Relevant Orders   CBC With Differential   Iron, TIBC and Ferritin Panel   Comprehensive metabolic panel   Hemoglobin A1c   Thyroid Panel With TSH   Shortness of breath       Relevant Medications   fluticasone furoate-vilanterol (BREO ELLIPTA) 200-25 MCG/INH AEPB      Meds ordered this encounter  Medications   DISCONTD: fluticasone furoate-vilanterol (BREO ELLIPTA) 200-25 MCG/INH AEPB    Sig: Inhale 1 puff into the lungs 2 (two) times daily.    Dispense:  28 each    Refill:  2    Order Specific Question:   Supervising  Provider    Answer:   Carlota Raspberry, JEFFREY R [[id number]]   fluticasone furoate-vilanterol (BREO ELLIPTA) 200-25 MCG/INH AEPB    Sig: Inhale 1 puff into the lungs daily.    Dispense:  28 each    Refill:  2    Order Specific Question:   Supervising Provider    Answer:   Carlota Raspberry, JEFFREY R [[id number]]    Follow-up: No follow-ups on file.   PLAN  No clearly adventitious breath sounds on exam, but pt still on 3L o2 is concerning. Will refer to pulmonology for further work up  breo ellipta once daily in the mean time as this may provide additional effect to albuterol  Given red flags and reasons to return to clinic  Patient encouraged to call clinic with any questions, comments, or concerns.  Maximiano Coss, NP

## 2020-06-13 NOTE — Patient Instructions (Signed)
° ° ° °  If you have lab work done today you will be contacted with your lab results within the next 2 weeks.  If you have not heard from us then please contact us. The fastest way to get your results is to register for My Chart. ° ° °IF you received an x-ray today, you will receive an invoice from [facility]. Please contact [facility] at [phone] with questions or concerns regarding your invoice.  ° °IF you received labwork today, you will receive an invoice from [facility]. Please contact [facility] at [phone] with questions or concerns regarding your invoice.  ° °Our billing staff will not be able to assist you with questions regarding bills from these companies. ° °You will be contacted with the lab results as soon as they are available. The fastest way to get your results is to activate your My Chart account. Instructions are located on the last page of this paperwork. If you have not heard from us regarding the results in 2 weeks, please contact this office. °  ° ° ° °

## 2020-06-14 ENCOUNTER — Telehealth: Payer: Self-pay | Admitting: Family Medicine

## 2020-06-14 ENCOUNTER — Telehealth: Payer: Self-pay

## 2020-06-14 LAB — THYROID PANEL WITH TSH
Free Thyroxine Index: 2.1 (ref 1.2–4.9)
T3 Uptake Ratio: 22 % — ABNORMAL LOW (ref 24–39)
T4, Total: 9.4 ug/dL (ref 4.5–12.0)
TSH: 1.1 u[IU]/mL (ref 0.450–4.500)

## 2020-06-14 LAB — CBC WITH DIFFERENTIAL
Basophils Absolute: 0 10*3/uL (ref 0.0–0.2)
Basos: 0 %
EOS (ABSOLUTE): 0.3 10*3/uL (ref 0.0–0.4)
Eos: 4 %
Hematocrit: 33.3 % — ABNORMAL LOW (ref 34.0–46.6)
Hemoglobin: 10.8 g/dL — ABNORMAL LOW (ref 11.1–15.9)
Immature Grans (Abs): 0.1 10*3/uL (ref 0.0–0.1)
Immature Granulocytes: 1 %
Lymphocytes Absolute: 1.3 10*3/uL (ref 0.7–3.1)
Lymphs: 16 %
MCH: 25.8 pg — ABNORMAL LOW (ref 26.6–33.0)
MCHC: 32.4 g/dL (ref 31.5–35.7)
MCV: 80 fL (ref 79–97)
Monocytes Absolute: 0.5 10*3/uL (ref 0.1–0.9)
Monocytes: 6 %
Neutrophils Absolute: 5.9 10*3/uL (ref 1.4–7.0)
Neutrophils: 73 %
RBC: 4.18 x10E6/uL (ref 3.77–5.28)
RDW: 15.2 % (ref 11.7–15.4)
WBC: 8 10*3/uL (ref 3.4–10.8)

## 2020-06-14 LAB — COMPREHENSIVE METABOLIC PANEL
ALT: 33 IU/L — ABNORMAL HIGH (ref 0–32)
AST: 17 IU/L (ref 0–40)
Albumin/Globulin Ratio: 1.4 (ref 1.2–2.2)
Albumin: 3.7 g/dL — ABNORMAL LOW (ref 3.8–4.8)
Alkaline Phosphatase: 68 IU/L (ref 44–121)
BUN/Creatinine Ratio: 13 (ref 9–23)
BUN: 10 mg/dL (ref 6–24)
Bilirubin Total: 0.2 mg/dL (ref 0.0–1.2)
CO2: 22 mmol/L (ref 20–29)
Calcium: 9.5 mg/dL (ref 8.7–10.2)
Chloride: 105 mmol/L (ref 96–106)
Creatinine, Ser: 0.78 mg/dL (ref 0.57–1.00)
GFR calc Af Amer: 103 mL/min/{1.73_m2} (ref >59)
GFR calc non Af Amer: 90 mL/min/{1.73_m2} (ref >59)
Globulin, Total: 2.7 g/dL (ref 1.5–4.5)
Glucose: 83 mg/dL (ref 65–99)
Potassium: 3.6 mmol/L (ref 3.5–5.2)
Sodium: 140 mmol/L (ref 134–144)
Total Protein: 6.4 g/dL (ref 6.0–8.5)

## 2020-06-14 LAB — HEMOGLOBIN A1C
Est. average glucose Bld gHb Est-mCnc: 134 mg/dL
Hgb A1c MFr Bld: 6.3 % — ABNORMAL HIGH (ref 4.8–5.6)

## 2020-06-14 LAB — IRON,TIBC AND FERRITIN PANEL
Ferritin: 51 ng/mL (ref 15–150)
Iron Saturation: 12 % — ABNORMAL LOW (ref 15–55)
Iron: 39 ug/dL (ref 27–159)
Total Iron Binding Capacity: 325 ug/dL (ref 250–450)
UIBC: 286 ug/dL (ref 131–425)

## 2020-06-14 NOTE — Telephone Encounter (Signed)
Spoke with pt regarding lab results and the ct scan calls she has been receiving. Pt has been receiving calls in regards to imaging that was already done. Sent message to [facility] to close  referral to stop the calls. EXCLUDING  the pulmonary referral which is pending appt

## 2020-06-14 NOTE — Telephone Encounter (Signed)
Patient called to complain about several facilities receiving calls to schedule her CT which was already done. She wants to stop the calls from going out. Please advise at [phone]

## 2020-06-14 NOTE — Telephone Encounter (Signed)
Sent message to [facility]  to assist with closing pt referral

## 2020-06-14 NOTE — Telephone Encounter (Signed)
Please close referral.

## 2020-06-14 NOTE — Progress Notes (Signed)
If we could call patient -   Blood counts / anemia improving. Otherwise labs are stable. Follow up with pulmonology - hopefully they will call soon  Thanks,  Denice Paradise

## 2020-06-14 NOTE — Telephone Encounter (Signed)
Pt is calling and keeps getting phone calls for a CT angio gram which is no longer needed. [facility] has received 4 orders for same scan, and now [facility]  has received an order for the same scan / this referral was made by [facility] .again scan is no longer needed because a( VG scan was done to replace CT scan) Patient is concerned because every time they call referral line goes straight to voice mail . Patient is upset because she keeps getting phone calls .   Please reach out to patient asap . She needs to speak with someone .  [phone]

## 2020-06-21 ENCOUNTER — Telehealth: Payer: Self-pay | Admitting: Internal Medicine

## 2020-06-21 ENCOUNTER — Encounter: Payer: Self-pay | Admitting: Internal Medicine

## 2020-06-21 ENCOUNTER — Ambulatory Visit (INDEPENDENT_AMBULATORY_CARE_PROVIDER_SITE_OTHER): Payer: 59 | Admitting: Internal Medicine

## 2020-06-21 ENCOUNTER — Other Ambulatory Visit: Payer: Self-pay

## 2020-06-21 VITALS — BP 120/70 | HR 87 | Temp 97.4°F | Ht 69.0 in | Wt 232.0 lb

## 2020-06-21 DIAGNOSIS — U071 COVID-19: Secondary | ICD-10-CM

## 2020-06-21 DIAGNOSIS — J9611 Chronic respiratory failure with hypoxia: Secondary | ICD-10-CM

## 2020-06-21 NOTE — Addendum Note (Signed)
Addended by: Vanessa Barbara on: 06/21/2020 12:18 PM   Modules accepted: Orders

## 2020-06-21 NOTE — Patient Instructions (Signed)
Continue taking Breo for now.  Continue taking albuterol as needed.

## 2020-06-21 NOTE — Progress Notes (Signed)
Danielle Hawkins    [id number]    [birth year]  Primary Care Physician:Greene, Ranell Patrick, MD  Referring Physician: Maximiano Coss, NP [place],  [place] [address] Reason for Consultation: shortness of breath after covid Date of Consultation: 06/21/2020  Chief complaint:   Chief Complaint  Patient presents with  . Consult    s/p covid pna 05/10/20, respiratory failure.  Sob.  Improving.     HPI: Danielle Hawkins is a 49 y.o. woman unvaccinated who had COVID 33 pneumonia at [facility]. Treated with anti-virals, steroids, discharged on 3LNC.  Here today for evaluation of ongoing shortness of breath. Was taking albuterol twice a day and is now taking this as a rescue. She was started on Breo 2 days ago and is taking albuterol as needed. No difference. She has difficulty taking a deep breath. She was feeling terrible when she had covid but still has some ongoing shortness of breath after 15-20 minutes.  Independent with ADLs.  Got tired with vacuuming. She has ongoing fatigue.   She has a history of bronchitis once before, over 5 years ago. No childhood respiratory disease or asthma.   She notes having poor sleep since her covid diagnosis. Denies snoring, orthopnea, le edema, apneas or witnessed apnea, excessive daytime sleepiness.   She does not have a puls-oximeter at home to know what her oxygen levels are.   Social history:  Occupation: She works as a [employer]  Exposures: lives at home with her mother Smoking history: used to be a social smoker, nothing recently in the last couple years.   Social History   Occupational History  . Not on file  Tobacco Use  . Smoking status: Former Smoker    Types: Cigarettes  . Smokeless tobacco: Never Used  . Tobacco comment: socially  Substance and Sexual Activity  . Alcohol use: Yes    Alcohol/week: 1.0 standard drink    Types: 1 Glasses of wine per week    Comment: Rare  . Drug use: No  .  Sexual activity: Yes    Birth control/protection: Pill    Relevant family history:  Family History  Problem Relation Age of Onset  . Hypertension Mother   . Hyperlipidemia Mother   . Diabetes Mother   . Heart disease Mother   . Hypertension Father   . Breast cancer Maternal Aunt 24  . Breast cancer Maternal Aunt 76    Past Medical History:  Diagnosis Date  . Acute non-recurrent sinusitis 06/04/2016  . Allergy   . Anemia   . Anxiety   . Blood in urine 06/03/2020  . Blood transfusion without reported diagnosis   . COVID-19   . Dyspnea on exertion 06/03/2020  . Encounter for blood transfusion   . Essential hypertension 06/04/2016  . Fibroid   . 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 Hypertension   . Hypertensive disorder 06/03/2020  . On home oxygen therapy 06/03/2020  . Renal disorder    kidney stones    Past Surgical History:  Procedure Laterality Date  . GYNECOLOGIC CRYOSURGERY  early 20's  . keloid removal    . LITHOTRIPSY       Physical Exam: Blood pressure 120/70, pulse 87, temperature (!) 97.4 F (36.3 C), temperature source Temporal, height 5\' 9"  (1.753 m), weight 232 lb (105.2 kg), SpO2 99 %. Gen:      No acute distress ENT:  no nasal polyps,  mucus membranes moist Lungs:    No increased respiratory effort, symmetric chest wall excursion, clear to auscultation bilaterally, no wheezes or crackles CV:         Regular rate and rhythm; no murmurs, rubs, or gallops.  No pedal edema Abd:      + bowel sounds; soft, non-tender; no distension MSK: no acute synovitis of DIP or PIP joints, no mechanics hands.  Skin:      Warm and dry; no rashes Neuro: normal speech, no focal facial asymmetry Psych: alert and oriented x3, normal mood and affect   Data Reviewed/Medical Decision Making:  Independent interpretation of tests: Imaging: Chest xray Jun 03 2020 personally reviewed, resolved pulmonary opacities when compared to 05/17/20 chest  xray.  Low probability V/Q scan reviewed 06/06/20  PFTs: None on file  Labs:  Lab Results  Component Value Date   WBC 8.0 06/13/2020   HGB 10.8 (L) 06/13/2020   HCT 33.3 (L) 06/13/2020   MCV 80 06/13/2020   PLT 256 06/03/2020   Lab Results  Component Value Date   NA 140 06/13/2020   K 3.6 06/13/2020   CL 105 06/13/2020   CO2 22 06/13/2020     Immunization status:  Immunization History  Administered Date(s) Administered  . Tdap 08/09/2016    . I reviewed prior external note(s) from primary care . I reviewed the result(s) of the labs and imaging as noted above.  . I have ordered desat study.   Assessment:  Chronic Hypoxemic respiratory failure Covid 19 Infection  Plan/Recommendations: Overall she is improving and has had radiographic resolution of her pneumonia. Continue breo and prn albuterol We cannot perform PFTs until she is 90 days from her covid 19 diagnosis.  Will perform ambulatory desaturation study today.   - she desaturated to 89% on room air with exertion today. I offered to discontinue her oxygen for her. She will obtain a puls-oximeter at home to monitor when she has symptoms. I am happy to see her back as needed.  Return to Care: Return if symptoms worsen or fail to improve, for shortness of breath.  Lenice Llamas, MD Pulmonary and [place]  CC: Maximiano Coss, NP

## 2020-06-22 ENCOUNTER — Telehealth: Payer: Self-pay | Admitting: Internal Medicine

## 2020-06-22 NOTE — Telephone Encounter (Signed)
Called and spoke with pt who stated she is still waiting for her O2 to be picked up by [facility]. Pt wants to make sure that the order was not sent to the wrong DME.  Order was placed after pt's OV 12/7 with Dr. Shearon Stalls. Pt said the fax number that this needs to be sent to is [phone]. Routing to [facility] as an [place].

## 2020-06-22 NOTE — Telephone Encounter (Signed)
Order was placed yesterday 12/7 and faxed to [facility] [place] [facility]

## 2020-06-23 DIAGNOSIS — U071 COVID-19: Secondary | ICD-10-CM | POA: Diagnosis not present

## 2020-06-23 NOTE — Telephone Encounter (Signed)
Spoke to pt [facility] picked up her 02 today I will refax the order to them Danielle Hawkins

## 2020-08-15 ENCOUNTER — Encounter: Payer: Self-pay | Admitting: Registered Nurse

## 2020-08-15 NOTE — Progress Notes (Signed)
Established Patient Office Visit  Subjective:  Patient ID: Danielle Hawkins, female    DOB: 04/19/1971  Age: 50 y.o. MRN: [id number]  CC:  Chief Complaint  Patient presents with  . Follow-up    Patient states she is having a visit to discuss her paperwork , when to return to work, and also a CT Scan was ordered.    HPI Danielle Hawkins presents for follow up   Wants to discuss specifics of return to work. Not sure she's quite ready. Still having fatigue and shob.  Wants to discuss results of CT scan ordered at [facility]. Results showed that it ruled out PE.  Otherwise, would like to discuss status of fmla paperwork and course of illness.   Past Medical History:  Diagnosis Date  . Acute non-recurrent sinusitis 06/04/2016  . Allergy   . Anemia   . Anxiety   . Blood in urine 06/03/2020  . Blood transfusion without reported diagnosis   . COVID-19   . Dyspnea on exertion 06/03/2020  . Encounter for blood transfusion   . Essential hypertension 06/04/2016  . Fibroid   . 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 Hypertension   . Hypertensive disorder 06/03/2020  . On home oxygen therapy 06/03/2020  . Renal disorder    kidney stones    Past Surgical History:  Procedure Laterality Date  . GYNECOLOGIC CRYOSURGERY  early 20's  . keloid removal    . LITHOTRIPSY      Family History  Problem Relation Age of Onset  . Hypertension Mother   . Hyperlipidemia Mother   . Diabetes Mother   . Heart disease Mother   . Hypertension Father   . Breast cancer Maternal Aunt 52  . Breast cancer Maternal Aunt 24    Social History   Socioeconomic History  . Marital status: Single    Spouse name: Not on file  . Number of children: Not on file  . Years of education: Not on file  . Highest education level: Not on file  Occupational History  . Not on file  Tobacco Use  . Smoking status: Former Smoker    Types: Cigarettes  . Smokeless tobacco: Never Used  .  Tobacco comment: socially  Substance and Sexual Activity  . Alcohol use: Yes    Alcohol/week: 1.0 standard drink    Types: 1 Glasses of wine per week    Comment: Rare  . Drug use: No  . Sexual activity: Yes    Birth control/protection: Pill  Other Topics Concern  . Not on file  Social History Narrative  . Not on file   Social Determinants of Health   Financial Resource Strain: Not on file  Food Insecurity: Not on file  Transportation Needs: Not on file  Physical Activity: Not on file  Stress: Not on file  Social Connections: Not on file  Intimate Partner Violence: Not on file    Outpatient Medications Prior to Visit  Medication Sig Dispense Refill  . ALPRAZolam (XANAX) 0.5 MG tablet Take 1 tablet by mouth twice daily as needed for anxiety 30 tablet 0  . chlorthalidone (HYGROTON) 25 MG tablet Take 0.5 tablets (12.5 mg total) by mouth daily. 45 tablet 0   No facility-administered medications prior to visit.    Allergies  Allergen Reactions  . Benzalkonium Chloride Other (See Comments)    Burning  . Diflucan [Fluconazole] Hives, Itching and Swelling  . Iopamidol Itching and Swelling  . Neosporin [Neomycin-Bacitracin  Zn-Polymyx] Other (See Comments)    "burning"  . Niacin And Related Itching  . Other Hives, Itching and Other (See Comments)    IV dye Allergic to metal. Burning reaction   . Oxycodone-Acetaminophen Itching  . Percocet [Oxycodone-Acetaminophen] Hives and Itching  . Red Dye Hives and Itching  . Tramadol Hives and Itching  . Niacin Itching  . Iodinated Diagnostic Agents Itching and Hives  . Latex Itching    Irration on site of contact    ROS Review of Systems  Constitutional: Positive for fatigue. Negative for fever and unexpected weight change.  HENT: Negative.   Eyes: Negative.   Respiratory: Positive for cough and shortness of breath. Negative for apnea, choking, chest tightness, wheezing and stridor.   Cardiovascular: Negative.    Gastrointestinal: Negative.   Genitourinary: Negative.   Musculoskeletal: Negative.   Skin: Negative.   Neurological: Negative.   Psychiatric/Behavioral: Negative.   All other systems reviewed and are negative.     Objective:    Physical Exam Vitals and nursing note reviewed.  Constitutional:      General: She is not in acute distress.    Appearance: Normal appearance. She is normal weight. She is not ill-appearing, toxic-appearing or diaphoretic.  Cardiovascular:     Rate and Rhythm: Normal rate and regular rhythm.     Heart sounds: Normal heart sounds. No murmur heard. No friction rub. No gallop.   Pulmonary:     Effort: Pulmonary effort is normal. No respiratory distress.     Breath sounds: No stridor. Wheezing present. No rhonchi or rales.  Chest:     Chest wall: No tenderness.  Skin:    General: Skin is warm and dry.  Neurological:     General: No focal deficit present.     Mental Status: She is alert and oriented to person, place, and time. Mental status is at baseline.  Psychiatric:        Mood and Affect: Mood normal.        Behavior: Behavior normal.        Thought Content: Thought content normal.        Judgment: Judgment normal.     There were no vitals taken for this visit. Wt Readings from Last 3 Encounters:  06/21/20 232 lb (105.2 kg)  06/13/20 230 lb 4.8 oz (104.5 kg)  06/06/20 230 lb 3.2 oz (104.4 kg)     Health Maintenance Due  Topic Date Due  . COVID-19 Vaccine (1) Never done  . COLONOSCOPY (Pts 45-13yrs Insurance coverage will need to be confirmed)  Never done    There are no preventive care reminders to display for this patient.  Lab Results  Component Value Date   TSH 1.100 06/13/2020   Lab Results  Component Value Date   WBC 8.0 06/13/2020   HGB 10.8 (L) 06/13/2020   HCT 33.3 (L) 06/13/2020   MCV 80 06/13/2020   PLT 256 06/03/2020   Lab Results  Component Value Date   NA 140 06/13/2020   K 3.6 06/13/2020   CO2 22  06/13/2020   GLUCOSE 83 06/13/2020   BUN 10 06/13/2020   CREATININE 0.78 06/13/2020   BILITOT 0.2 06/13/2020   ALKPHOS 68 06/13/2020   AST 17 06/13/2020   ALT 33 (H) 06/13/2020   PROT 6.4 06/13/2020   ALBUMIN 3.7 (L) 06/13/2020   CALCIUM 9.5 06/13/2020   ANIONGAP 10 11/05/2016   Lab Results  Component Value Date   CHOL 160 12/31/2017  Lab Results  Component Value Date   HDL 34 (L) 12/31/2017   Lab Results  Component Value Date   LDLCALC 116 (H) 12/31/2017   Lab Results  Component Value Date   TRIG 52 12/31/2017   Lab Results  Component Value Date   CHOLHDL 4.7 (H) 12/31/2017   Lab Results  Component Value Date   HGBA1C 6.3 (H) 06/13/2020      Assessment & Plan:   Problem List Items Addressed This Visit      Other   COVID-19 - Primary      No orders of the defined types were placed in this encounter.   Follow-up: No follow-ups on file.   PLAN  While she is having a very hard time recovering from covid, she continues to make improvements. Will continue to hold her from work until such time as she feels ready to return  Discussed that no PE on imaging is very encouraging given the concerns we had had at previous visits  Continue supportive care and rest  Patient encouraged to call clinic with any questions, comments, or concerns.  Maximiano Coss, NP

## 2020-10-25 DIAGNOSIS — Z113 Encounter for screening for infections with a predominantly sexual mode of transmission: Secondary | ICD-10-CM | POA: Diagnosis not present

## 2020-10-25 DIAGNOSIS — N898 Other specified noninflammatory disorders of vagina: Secondary | ICD-10-CM | POA: Diagnosis not present

## 2020-12-05 DIAGNOSIS — H5203 Hypermetropia, bilateral: Secondary | ICD-10-CM | POA: Diagnosis not present

## 2020-12-05 DIAGNOSIS — H5111 Convergence insufficiency: Secondary | ICD-10-CM | POA: Diagnosis not present

## 2020-12-05 DIAGNOSIS — H524 Presbyopia: Secondary | ICD-10-CM | POA: Diagnosis not present

## 2020-12-05 DIAGNOSIS — H43812 Vitreous degeneration, left eye: Secondary | ICD-10-CM | POA: Diagnosis not present

## 2020-12-20 DIAGNOSIS — R070 Pain in throat: Secondary | ICD-10-CM | POA: Diagnosis not present

## 2020-12-20 DIAGNOSIS — J040 Acute laryngitis: Secondary | ICD-10-CM | POA: Diagnosis not present

## 2020-12-20 DIAGNOSIS — Z20828 Contact with and (suspected) exposure to other viral communicable diseases: Secondary | ICD-10-CM | POA: Diagnosis not present

## 2020-12-29 ENCOUNTER — Ambulatory Visit (INDEPENDENT_AMBULATORY_CARE_PROVIDER_SITE_OTHER): Payer: 59 | Admitting: Family Medicine

## 2020-12-29 ENCOUNTER — Encounter: Payer: Self-pay | Admitting: Family Medicine

## 2020-12-29 ENCOUNTER — Other Ambulatory Visit: Payer: Self-pay

## 2020-12-29 VITALS — BP 122/60 | HR 86 | Temp 98.4°F | Resp 16 | Ht 69.0 in | Wt 230.0 lb

## 2020-12-29 DIAGNOSIS — Z0000 Encounter for general adult medical examination without abnormal findings: Secondary | ICD-10-CM | POA: Diagnosis not present

## 2020-12-29 DIAGNOSIS — F411 Generalized anxiety disorder: Secondary | ICD-10-CM | POA: Diagnosis not present

## 2020-12-29 DIAGNOSIS — Z13220 Encounter for screening for lipoid disorders: Secondary | ICD-10-CM | POA: Diagnosis not present

## 2020-12-29 DIAGNOSIS — F410 Panic disorder [episodic paroxysmal anxiety] without agoraphobia: Secondary | ICD-10-CM | POA: Diagnosis not present

## 2020-12-29 DIAGNOSIS — Z130 Encounter for screening for diseases of the blood and blood-forming organs and certain disorders involving the immune mechanism: Secondary | ICD-10-CM | POA: Diagnosis not present

## 2020-12-29 DIAGNOSIS — I10 Essential (primary) hypertension: Secondary | ICD-10-CM

## 2020-12-29 DIAGNOSIS — Z1211 Encounter for screening for malignant neoplasm of colon: Secondary | ICD-10-CM | POA: Diagnosis not present

## 2020-12-29 DIAGNOSIS — Z131 Encounter for screening for diabetes mellitus: Secondary | ICD-10-CM

## 2020-12-29 MED ORDER — CHLORTHALIDONE 25 MG PO TABS: 12.5000 mg | ORAL_TABLET | Freq: Every day | ORAL | 2 refills | Status: DC

## 2020-12-29 MED ORDER — ALPRAZOLAM 0.5 MG PO TABS: 0.5000 mg | ORAL_TABLET | Freq: Two times a day (BID) | ORAL | 0 refills | Status: DC | PRN

## 2020-12-29 NOTE — Progress Notes (Signed)
Subjective:  Patient ID: Danielle Hawkins, female    DOB: Jul 30, 1970  Age: 50 y.o. 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  Has been seen by my colleague since that time.  COVID-19 infection late last year with COVID-pneumonia and respiratory failure.  Requiring oxygen.has recovered fully. Notices at times that her reaction time or memory temporarily off for a brief moment. Similar to brain fog. No further Breo need.   Chronic anemia With history of fibroids, followed by OB/GYN, treated with iron supplement.  OBGYN Dr. Willis Modena. On iron supplement daily. Occasionally misses dose - takes during menses.   Lab Results  Component Value Date   WBC 8.0 06/13/2020   HGB 10.8 (L) 06/13/2020   HCT 33.3 (L) 06/13/2020   MCV 80 06/13/2020   PLT 256 06/03/2020   Hypertension: History of hypertension when discussed in 2019, was taking chlorthalidone and Norvasc at that time. Now on chlorthalidone 12.5mg  qd.  Home readings:  usually 120/70-80.  BP Readings from Last 3 Encounters:  12/29/20 122/60  06/21/20 120/70  06/13/20 117/80   Lab Results  Component Value Date   CREATININE 0.78 06/13/2020   Anxiety/situational anxiety Last discussed in 2019, episodic Xanax at that time with some family stressors. Stress/anxiety comes and goes. Worse with covid. Better now,  controls with breathing. Last alprazolam Rx #15 06/17/20. Controlled substance database (PDMP) reviewed. No concerns appreciated.   Prior medical provider tried daily sertraline, lexapro - did not like - could no focus, function. Now 1-2 days per week. Still helping mother.   No flowsheet data found.    Cancer screening: Pap testing with OB/GYN, last one listed Dec 06, 2017. Had more recent with OBGYN Mammogram: at [facility].  Colon cancer screening: No personal history of polyps or family history of colon cancer. Screening options with colonoscopy versus Cologuard discussed. Discussed timing of repeat testing intervals if normal, as well as potential need for diagnostic Colonoscopy if positive Cologuard. Understanding expressed, and chose Cologuard.   Immunization History  Administered Date(s) Administered   Tdap 08/09/2016  Covid vaccine - has not received. Considering vaccine now.  Recent laryngitis -had neg covid test.  Shingles vaccine - deferred.   Depression screen [facility] 2/9 06/13/2020 06/08/2020 06/03/2020 12/30/2017 03/21/2017  Decreased Interest 0 0 0 0 0  Down, Depressed, Hopeless 0 0 0 0 0  PHQ - 2 Score 0 0 0 0 0   No results found. Optho - Dr. Jerline Pain, yearly visit.   Will be scheduling dentist visit.   Min exercise - fatigue at times, limited d/t weight.    History Patient Active Problem List   Diagnosis Date Noted   On home oxygen therapy 06/03/2020   Dyspnea on exertion 06/03/2020   Blood in urine 06/03/2020   Hypertensive disorder 06/03/2020   Allergy    Anemia    Anxiety    Blood transfusion without reported diagnosis    Encounter for blood transfusion    Fibroid    HSV infection    Hypertension    Renal disorder    COVID-19 05/26/2020   Essential hypertension 06/04/2016   Acute non-recurrent sinusitis 06/04/2016   Past Medical History:  Diagnosis Date   Acute non-recurrent sinusitis 06/04/2016   Allergy    Anemia    Anxiety    Blood in urine 06/03/2020   Blood transfusion without reported diagnosis    COVID-19    Dyspnea on exertion 06/03/2020   Encounter for blood transfusion    Essential hypertension 06/04/2016   Fibroid    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   Hypertension    Hypertensive disorder 06/03/2020   On home oxygen therapy 06/03/2020   Renal disorder    kidney stones    Past Surgical History:  Procedure Laterality Date   GYNECOLOGIC CRYOSURGERY  early 20's   keloid removal     LITHOTRIPSY     Allergies  Allergen Reactions   Benzalkonium Chloride Other (See Comments)    Burning   Diflucan [Fluconazole] Hives, Itching and Swelling   Iopamidol Itching and Swelling   Neosporin [Neomycin-Bacitracin Zn-Polymyx] Other (See Comments)    "burning"   Niacin And Related Itching   Other Hives, Itching and Other (See Comments)    IV dye Allergic to metal. Burning reaction    Oxycodone-Acetaminophen Itching   Percocet [Oxycodone-Acetaminophen] Hives and Itching   Red Dye Hives and Itching   Tramadol Hives and Itching   Niacin Itching   Iodinated Diagnostic Agents Itching and Hives   Latex Itching    Irration on site of contact   Prior to Admission medications   Medication Sig Start Date End Date Taking? Authorizing Provider  ASHWAGANDHA PO Take 2 each by mouth daily.   Yes [provider]  Biotin 1 MG CAPS Take 1 tablet by mouth daily.   Yes [provider]  chlorthalidone (HYGROTON) 25 MG tablet Take 0.5 tablets (12.5 mg total) by mouth daily. 06/07/20  Yes Wendie Agreste, MD  diphenhydrAMINE (BENADRYL) 25 mg capsule Take 25 mg by mouth every 6 (six) hours as needed. As needed for allergic reaction.   Yes [provider]  Ferrous Sulfate 140 (45 Fe) MG TBCR Take 1 tablet by mouth daily.   Yes [provider]  fluticasone furoate-vilanterol (BREO ELLIPTA) 200-25 MCG/INH AEPB Inhale 1 puff into the lungs daily. 06/13/20  Yes Maximiano Coss, NP  ibuprofen (ADVIL) 200 MG tablet Take 200 mg by mouth every 6 (six) hours as needed. 2 tablets as needed.   Yes [provider]  Multiple Vitamin (MULTIVITAMIN WITH MINERALS) TABS tablet Take 1 tablet by mouth daily.   Yes [provider]  zinc gluconate 50 MG tablet Take 50 mg by mouth daily.   Yes [provider]  ALPRAZolam (XANAX) 0.5 MG tablet  Take 1 tablet (0.5 mg total) by mouth 2 (two) times daily as needed. for anxiety 12/29/20   Wendie Agreste, MD   Social History   Socioeconomic History   Marital status: Single    Spouse name: Not on file   Number of children: Not on file   Years of education: Not on file   Highest education level: Not on file  Occupational History   Not on file  Tobacco Use   Smoking status: Former    Pack years: 0.00    Types: Cigarettes   Smokeless tobacco: Never   Tobacco comments:    socially  Substance and Sexual Activity   Alcohol use: Yes    Alcohol/week: 1.0 standard drink    Types: 1 Glasses of wine per week    Comment: Rare   Drug use: No  Sexual activity: Yes    Birth control/protection: Pill  Other Topics Concern   Not on file  Social History Narrative   Not on file   Social Determinants of Health   Financial Resource Strain: Not on file  Food Insecurity: Not on file  Transportation Needs: Not on file  Physical Activity: Not on file  Stress: Not on file  Social Connections: Not on file  Intimate Partner Violence: Not on file    Review of Systems 13 point review of systems per patient health survey noted.  Negative other than as indicated above or in HPI.    Objective:   Vitals:   12/29/20 1506  BP: 122/60  Pulse: 86  Resp: 16  Temp: 98.4 F (36.9 C)  TempSrc: Temporal  SpO2: 97%  Weight: 230 lb (104.3 kg)  Height: 5\' 9"  (1.753 m)     Physical Exam Constitutional:      Appearance: She is well-developed.  HENT:     Head: Normocephalic and atraumatic.     Right Ear: External ear normal.     Left Ear: External ear normal.  Eyes:     Conjunctiva/sclera: Conjunctivae normal.     Pupils: Pupils are equal, round, and reactive to light.  Neck:     Thyroid: No thyromegaly.  Cardiovascular:     Rate and Rhythm: Normal rate and regular rhythm.     Heart sounds: Normal heart sounds. No murmur heard. Pulmonary:     Effort: Pulmonary effort is normal. No  respiratory distress.     Breath sounds: Normal breath sounds. No wheezing.  Abdominal:     General: Bowel sounds are normal.     Palpations: Abdomen is soft.     Tenderness: There is no abdominal tenderness.  Musculoskeletal:        General: No tenderness. Normal range of motion.     Cervical back: Normal range of motion and neck supple.  Lymphadenopathy:     Cervical: No cervical adenopathy.  Skin:    General: Skin is warm and dry.     Findings: No rash.  Neurological:     Mental Status: She is alert and oriented to person, place, and time.  Psychiatric:        Behavior: Behavior normal.        Thought Content: Thought content normal.       Assessment & Plan:  Luberta Grabinski is a 50 y.o. female . Annual physical exam - Plan: Cologuard, Comprehensive metabolic panel, Hemoglobin A1c, Lipid panel, CBC with Differential/Platelet  - -anticipatory guidance as below in AVS, screening labs above. Health maintenance items as above in HPI discussed/recommended as applicable. Will clarify timing of mammogram and pap as performed at [facility].   Anxiety state - Plan: ALPRAZolam (XANAX) 0.5 MG tablet, DISCONTINUED: ALPRAZolam (XANAX) 0.5 MG tablet Panic attacks - Plan: ALPRAZolam (XANAX) 0.5 MG tablet, DISCONTINUED: ALPRAZolam (XANAX) 0.5 MG tablet  -Overall stable.  Handout given on anxiety management.  Consider buspirone if more frequent need of alprazolam.  Did not tolerate 2 previous SSRIs.  Alprazolam refilled from infrequent dosing if needed for now.  RTC precautions if more persistent need.  Hypertension, unspecified type - Plan: Comprehensive metabolic panel Essential hypertension - Plan: chlorthalidone (HYGROTON) 25 MG tablet  - Stable, tolerating current regimen. Medications refilled. Labs pending as above.   Screening for hyperlipidemia - Plan: Comprehensive metabolic panel, Hemoglobin A1c, Lipid panel  Screening for diabetes mellitus - Plan: Comprehensive metabolic panel,  Hemoglobin A1c  Screening  for colon cancer - Plan: Cologuard  Screening, anemia, deficiency, iron - Plan: CBC with Differential/Platelet  -Continue iron supplement.  Check CBC.  Meds ordered this encounter  Medications   DISCONTD: ALPRAZolam (XANAX) 0.5 MG tablet    Sig: Take 1 tablet (0.5 mg total) by mouth 2 (two) times daily as needed. for anxiety    Dispense:  30 tablet    Refill:  0   ALPRAZolam (XANAX) 0.5 MG tablet    Sig: Take 1 tablet (0.5 mg total) by mouth 2 (two) times daily as needed. for anxiety    Dispense:  30 tablet    Refill:  0   chlorthalidone (HYGROTON) 25 MG tablet    Sig: Take 0.5 tablets (12.5 mg total) by mouth daily.    Dispense:  45 tablet    Refill:  2   Patient Instructions  If anxiety worsens, then I would consider buspirone. Xanax if needed for breakthrough symptoms is ok. Return to the clinic or go to the nearest emergency room if any of your symptoms worsen or new symptoms occur.  Keeping You Healthy  Get These Tests Blood Pressure- Have your blood pressure checked by your healthcare provider at least once a year.  Normal blood pressure is 120/80. Weight- Have your body mass index (BMI) calculated to screen for obesity.  BMI is a measure of body fat based on height and weight.  You can calculate your own BMI at [employer] Cholesterol- Have your cholesterol checked every year. Diabetes- Have your blood sugar checked every year if you have high blood pressure, high cholesterol, a family history of diabetes or if you are overweight. Pap Test - Have a pap test every 1 to 5 years if you have been sexually active.  If you are older than 65 and recent pap tests have been normal you may not need additional pap tests.  In addition, if you have had a hysterectomy  for benign disease additional pap tests are not necessary. Mammogram-Yearly mammograms are essential for early detection of breast cancer Screening for Colon Cancer- Colonoscopy  starting at age 83. Screening may begin sooner depending on your family history and other health conditions.  Follow up colonoscopy as directed by your Gastroenterologist. Screening for Osteoporosis- Screening begins at age 54 with bone density scanning, sooner if you are at higher risk for developing Osteoporosis.  Get these medicines Calcium with Vitamin D- Your body requires 1200-1500 mg of Calcium a day and [phone] IU of Vitamin D a day.  You can only absorb 500 mg of Calcium at a time therefore Calcium must be taken in 2 or 3 separate doses throughout the day. Hormones- Hormone therapy has been associated with increased risk for certain cancers and heart disease.  Talk to your healthcare provider about if you need relief from menopausal symptoms. Aspirin- Ask your healthcare provider about taking Aspirin to prevent Heart Disease and Stroke.  Get these Immuniztions Flu shot- Every fall Pneumonia shot- Once after the age of 92; if you are younger ask your healthcare provider if you need a pneumonia shot. Tetanus- Every ten years. Zostavax- Once after the age of 70 to prevent shingles.  Take these steps Don't smoke- Your healthcare provider can help you quit. For tips on how to quit, ask your healthcare provider or go to www.smokefree.gov or call 1-800 QUIT-NOW. Be physically active- Exercise 5 days a week for a minimum of 30 minutes.  If you are not already physically active, start slow and gradually  work up to 30 minutes of moderate physical activity.  Try walking, dancing, bike riding, swimming, etc. Eat a healthy diet- Eat a variety of healthy foods such as fruits, vegetables, whole grains, low fat milk, low fat cheeses, yogurt, lean meats, chicken, fish, eggs, dried beans, tofu, etc.  For more information go to www.thenutritionsource.org Dental visit- Brush and floss teeth twice daily; visit your dentist twice a year. Eye exam- Visit your Optometrist or Ophthalmologist yearly. Drink  alcohol in moderation- Limit alcohol intake to one drink or less a day.  Never drink and drive. Depression- Your emotional health is as important as your physical health.  If you're feeling down or losing interest in things you normally enjoy, please talk to your healthcare provider. Seat Belts- can save your life; always wear one Smoke/Carbon Monoxide detectors- These detectors need to be installed on the appropriate level of your home.  Replace batteries at least once a year. Violence- If anyone is threatening or hurting you, please tell your healthcare provider. Living Will/ Health care power of attorney- Discuss with your healthcare provider and family.  Managing Anxiety, Adult After being diagnosed with an anxiety disorder, you may be relieved to know why you have felt or behaved a certain way. You may also feel overwhelmed about the treatment ahead and what it will mean for your life. With care and support, youcan manage this condition and recover from it. How to manage lifestyle changes Managing stress and anxiety  Stress is your body's reaction to life changes and events, both good and bad. Most stress will last just a few hours, but stress can be ongoing and can lead to more than just stress. Although stress can play a major role in anxiety, it is not the same as anxiety. Stress is usually caused by something external, such as a deadline, test, or competition. Stress normally passes after thetriggering event has ended.  Anxiety is caused by something internal, such as imagining a terrible outcome or worrying that something will go wrong that will devastate you. Anxiety often does not go away even after the triggering event is over, and it can become long-term (chronic) worry. It is important to understand the differences between stress and anxiety and to manage your stress effectively so that it does not lead to ananxious response. Talk with your health care provider or a counselor to learn  more about reducing anxiety and stress. He or she may suggest tension reduction techniques, such as: Music therapy. This can include creating or listening to music that you enjoy and that inspires you. Mindfulness-based meditation. This involves being aware of your normal breaths while not trying to control your breathing. It can be done while sitting or walking. Centering prayer. This involves focusing on a word, phrase, or sacred image that means something to you and brings you peace. Deep breathing. To do this, expand your stomach and inhale slowly through your nose. Hold your breath for 3-5 seconds. Then exhale slowly, letting your stomach muscles relax. Self-talk. This involves identifying thought patterns that lead to anxiety reactions and changing those patterns. Muscle relaxation. This involves tensing muscles and then relaxing them. Choose a tension reduction technique that suits your lifestyle and personality. These techniques take time and practice. Set aside 5-15 minutes a day to do them. Therapists can offer counseling and training in these techniques. The training to help with anxiety may be covered by some insurance plans. Other things you can do to manage stress and anxiety include: Keeping  a stress/anxiety diary. This can help you learn what triggers your reaction and then learn ways to manage your response. Thinking about how you react to certain situations. You may not be able to control everything, but you can control your response. Making time for activities that help you relax and not feeling guilty about spending your time in this way. Visual imagery and yoga can help you stay calm and relax.  Medicines Medicines can help ease symptoms. Medicines for anxiety include: Anti-anxiety drugs. Antidepressants. Medicines are often used as a primary treatment for anxiety disorder. Medicines will be prescribed by a health care provider. When used together, medicines, psychotherapy, and  tension reduction techniques may be the most effectivetreatment. Relationships Relationships can play a big part in helping you recover. Try to spend more time connecting with trusted friends and family members. Consider going to couples counseling, taking family education classes, or going to familytherapy. Therapy can help you and others better understand your condition. How to recognize changes in your anxiety Everyone responds differently to treatment for anxiety. Recovery from anxiety happens when symptoms decrease and stop interfering with your daily activities at home or work. This may mean that you will start to: Have better concentration and focus. Worry will interfere less in your daily thinking. Sleep better. Be less irritable. Have more energy. Have improved memory. It is important to recognize when your condition is getting worse. Contact your health care provider if your symptoms interfere with home or work and you feellike your condition is not improving. Follow these instructions at home: Activity Exercise. Most adults should do the following: Exercise for at least 150 minutes each week. The exercise should increase your heart rate and make you sweat (moderate-intensity exercise). Strengthening exercises at least twice a week. Get the right amount and quality of sleep. Most adults need 7-9 hours of sleep each night. Lifestyle  Eat a healthy diet that includes plenty of vegetables, fruits, whole grains, low-fat dairy products, and lean protein. Do not eat a lot of foods that are high in solid fats, added sugars, or salt. Make choices that simplify your life. Do not use any products that contain nicotine or tobacco, such as cigarettes, e-cigarettes, and chewing tobacco. If you need help quitting, ask your health care provider. Avoid caffeine, alcohol, and certain over-the-counter cold medicines. These may make you feel worse. Ask your pharmacist which medicines to  avoid.  General instructions Take over-the-counter and prescription medicines only as told by your health care provider. Keep all follow-up visits as told by your health care provider. This is important. Where to find support You can get help and support from these sources: Self-help groups. Online and [employer]. A trusted spiritual leader. Couples counseling. Family education classes. Family therapy. Where to find more information You may find that joining a support group helps you deal with your anxiety. The following sources can help you locate counselors or support groups near you: [place]: www.mentalhealth[place].net Anxiety and Depression Association of [place] (ADAA): https://www.clark.net/ National Alliance on Mental Illness (NAMI): www.nami.org Contact a health care provider if you: Have a hard time staying focused or finishing daily tasks. Spend many hours a day feeling worried about everyday life. Become exhausted by worry. Start to have headaches, feel tense, or have nausea. Urinate more than normal. Have diarrhea. Get help right away if you have: A racing heart and shortness of breath. Thoughts of hurting yourself or others. If you ever feel like you may hurt yourself or  others, or have thoughts about taking your own life, get help right away. You can go to your nearest emergency department or call: Your local emergency services (911 in the [place] A suicide crisis helpline, such as the [place] at 787-688-2028. This is open 24 hours a day. Summary Taking steps to learn and use tension reduction techniques can help calm you and help prevent triggering an anxiety reaction. When used together, medicines, psychotherapy, and tension reduction techniques may be the most effective treatment. Family, friends, and partners can play a big part in helping you recover from an anxiety disorder. This information is not intended to  replace advice given to you by your health care provider. Make sure you discuss any questions you have with your healthcare provider. Document Revised: 12/02/2018 Document Reviewed: 12/02/2018 Elsevier Patient Education  2022 [place],   [name], MD [place] [employer] [facility] 12/29/20 4:34 PM

## 2020-12-29 NOTE — Patient Instructions (Addendum)
If anxiety worsens, then I would consider buspirone. Xanax if needed for breakthrough symptoms is ok. Return to the clinic or go to the nearest emergency room if any of your symptoms worsen or new symptoms occur.  Keeping You Healthy  Get These Tests Blood Pressure- Have your blood pressure checked by your healthcare provider at least once a year.  Normal blood pressure is 120/80. Weight- Have your body mass index (BMI) calculated to screen for obesity.  BMI is a measure of body fat based on height and weight.  You can calculate your own BMI at [employer] Cholesterol- Have your cholesterol checked every year. Diabetes- Have your blood sugar checked every year if you have high blood pressure, high cholesterol, a family history of diabetes or if you are overweight. Pap Test - Have a pap test every 1 to 5 years if you have been sexually active.  If you are older than 65 and recent pap tests have been normal you may not need additional pap tests.  In addition, if you have had a hysterectomy  for benign disease additional pap tests are not necessary. Mammogram-Yearly mammograms are essential for early detection of breast cancer Screening for Colon Cancer- Colonoscopy starting at age 30. Screening may begin sooner depending on your family history and other health conditions.  Follow up colonoscopy as directed by your Gastroenterologist. Screening for Osteoporosis- Screening begins at age 71 with bone density scanning, sooner if you are at higher risk for developing Osteoporosis.  Get these medicines Calcium with Vitamin D- Your body requires 1200-1500 mg of Calcium a day and [phone] IU of Vitamin D a day.  You can only absorb 500 mg of Calcium at a time therefore Calcium must be taken in 2 or 3 separate doses throughout the day. Hormones- Hormone therapy has been associated with increased risk for certain cancers and heart disease.  Talk to your healthcare provider about if you need relief  from menopausal symptoms. Aspirin- Ask your healthcare provider about taking Aspirin to prevent Heart Disease and Stroke.  Get these Immuniztions Flu shot- Every fall Pneumonia shot- Once after the age of 77; if you are younger ask your healthcare provider if you need a pneumonia shot. Tetanus- Every ten years. Zostavax- Once after the age of 32 to prevent shingles.  Take these steps Don't smoke- Your healthcare provider can help you quit. For tips on how to quit, ask your healthcare provider or go to www.smokefree.gov or call 1-800 QUIT-NOW. Be physically active- Exercise 5 days a week for a minimum of 30 minutes.  If you are not already physically active, start slow and gradually work up to 30 minutes of moderate physical activity.  Try walking, dancing, bike riding, swimming, etc. Eat a healthy diet- Eat a variety of healthy foods such as fruits, vegetables, whole grains, low fat milk, low fat cheeses, yogurt, lean meats, chicken, fish, eggs, dried beans, tofu, etc.  For more information go to www.thenutritionsource.org Dental visit- Brush and floss teeth twice daily; visit your dentist twice a year. Eye exam- Visit your Optometrist or Ophthalmologist yearly. Drink alcohol in moderation- Limit alcohol intake to one drink or less a day.  Never drink and drive. Depression- Your emotional health is as important as your physical health.  If you're feeling down or losing interest in things you normally enjoy, please talk to your healthcare provider. Seat Belts- can save your life; always wear one Smoke/Carbon Monoxide detectors- These detectors need to be installed on the appropriate level  of your home.  Replace batteries at least once a year. Violence- If anyone is threatening or hurting you, please tell your healthcare provider. Living Will/ Health care power of attorney- Discuss with your healthcare provider and family.  Managing Anxiety, Adult After being diagnosed with an anxiety disorder,  you may be relieved to know why you have felt or behaved a certain way. You may also feel overwhelmed about the treatment ahead and what it will mean for your life. With care and support, youcan manage this condition and recover from it. How to manage lifestyle changes Managing stress and anxiety  Stress is your body's reaction to life changes and events, both good and bad. Most stress will last just a few hours, but stress can be ongoing and can lead to more than just stress. Although stress can play a major role in anxiety, it is not the same as anxiety. Stress is usually caused by something external, such as a deadline, test, or competition. Stress normally passes after thetriggering event has ended.  Anxiety is caused by something internal, such as imagining a terrible outcome or worrying that something will go wrong that will devastate you. Anxiety often does not go away even after the triggering event is over, and it can become long-term (chronic) worry. It is important to understand the differences between stress and anxiety and to manage your stress effectively so that it does not lead to ananxious response. Talk with your health care provider or a counselor to learn more about reducing anxiety and stress. He or she may suggest tension reduction techniques, such as: Music therapy. This can include creating or listening to music that you enjoy and that inspires you. Mindfulness-based meditation. This involves being aware of your normal breaths while not trying to control your breathing. It can be done while sitting or walking. Centering prayer. This involves focusing on a word, phrase, or sacred image that means something to you and brings you peace. Deep breathing. To do this, expand your stomach and inhale slowly through your nose. Hold your breath for 3-5 seconds. Then exhale slowly, letting your stomach muscles relax. Self-talk. This involves identifying thought patterns that lead to anxiety  reactions and changing those patterns. Muscle relaxation. This involves tensing muscles and then relaxing them. Choose a tension reduction technique that suits your lifestyle and personality. These techniques take time and practice. Set aside 5-15 minutes a day to do them. Therapists can offer counseling and training in these techniques. The training to help with anxiety may be covered by some insurance plans. Other things you can do to manage stress and anxiety include: Keeping a stress/anxiety diary. This can help you learn what triggers your reaction and then learn ways to manage your response. Thinking about how you react to certain situations. You may not be able to control everything, but you can control your response. Making time for activities that help you relax and not feeling guilty about spending your time in this way. Visual imagery and yoga can help you stay calm and relax.  Medicines Medicines can help ease symptoms. Medicines for anxiety include: Anti-anxiety drugs. Antidepressants. Medicines are often used as a primary treatment for anxiety disorder. Medicines will be prescribed by a health care provider. When used together, medicines, psychotherapy, and tension reduction techniques may be the most effectivetreatment. Relationships Relationships can play a big part in helping you recover. Try to spend more time connecting with trusted friends and family members. Consider going to couples counseling, taking  family education classes, or going to familytherapy. Therapy can help you and others better understand your condition. How to recognize changes in your anxiety Everyone responds differently to treatment for anxiety. Recovery from anxiety happens when symptoms decrease and stop interfering with your daily activities at home or work. This may mean that you will start to: Have better concentration and focus. Worry will interfere less in your daily thinking. Sleep better. Be less  irritable. Have more energy. Have improved memory. It is important to recognize when your condition is getting worse. Contact your health care provider if your symptoms interfere with home or work and you feellike your condition is not improving. Follow these instructions at home: Activity Exercise. Most adults should do the following: Exercise for at least 150 minutes each week. The exercise should increase your heart rate and make you sweat (moderate-intensity exercise). Strengthening exercises at least twice a week. Get the right amount and quality of sleep. Most adults need 7-9 hours of sleep each night. Lifestyle  Eat a healthy diet that includes plenty of vegetables, fruits, whole grains, low-fat dairy products, and lean protein. Do not eat a lot of foods that are high in solid fats, added sugars, or salt. Make choices that simplify your life. Do not use any products that contain nicotine or tobacco, such as cigarettes, e-cigarettes, and chewing tobacco. If you need help quitting, ask your health care provider. Avoid caffeine, alcohol, and certain over-the-counter cold medicines. These may make you feel worse. Ask your pharmacist which medicines to avoid.  General instructions Take over-the-counter and prescription medicines only as told by your health care provider. Keep all follow-up visits as told by your health care provider. This is important. Where to find support You can get help and support from these sources: Self-help groups. Online and [employer]. A trusted spiritual leader. Couples counseling. Family education classes. Family therapy. Where to find more information You may find that joining a support group helps you deal with your anxiety. The following sources can help you locate counselors or support groups near you: [place]: www.mentalhealthamerica.net Anxiety and Depression Association of [place] (ADAA): https://www.clark.net/ National Alliance  on Mental Illness (NAMI): www.nami.org Contact a health care provider if you: Have a hard time staying focused or finishing daily tasks. Spend many hours a day feeling worried about everyday life. Become exhausted by worry. Start to have headaches, feel tense, or have nausea. Urinate more than normal. Have diarrhea. Get help right away if you have: A racing heart and shortness of breath. Thoughts of hurting yourself or others. If you ever feel like you may hurt yourself or others, or have thoughts about taking your own life, get help right away. You can go to your nearest emergency department or call: Your local emergency services (911 in the [place] A suicide crisis helpline, such as the [place] at 9307767286. This is open 24 hours a day. Summary Taking steps to learn and use tension reduction techniques can help calm you and help prevent triggering an anxiety reaction. When used together, medicines, psychotherapy, and tension reduction techniques may be the most effective treatment. Family, friends, and partners can play a big part in helping you recover from an anxiety disorder. This information is not intended to replace advice given to you by your health care provider. Make sure you discuss any questions you have with your healthcare provider. Document Revised: 12/02/2018 Document Reviewed: 12/02/2018 Elsevier Patient Education  [place].

## 2020-12-30 LAB — CBC WITH DIFFERENTIAL/PLATELET
Basophils Absolute: 0.1 10*3/uL (ref 0.0–0.1)
Basophils Relative: 1.5 % (ref 0.0–3.0)
Eosinophils Absolute: 0.3 10*3/uL (ref 0.0–0.7)
Eosinophils Relative: 3.2 % (ref 0.0–5.0)
HCT: 31.1 % — ABNORMAL LOW (ref 36.0–46.0)
Hemoglobin: 10.2 g/dL — ABNORMAL LOW (ref 12.0–15.0)
Lymphocytes Relative: 19.4 % (ref 12.0–46.0)
Lymphs Abs: 1.7 10*3/uL (ref 0.7–4.0)
MCHC: 32.8 g/dL (ref 30.0–36.0)
MCV: 75.4 fl — ABNORMAL LOW (ref 78.0–100.0)
Monocytes Absolute: 0.5 10*3/uL (ref 0.1–1.0)
Monocytes Relative: 6.1 % (ref 3.0–12.0)
Neutro Abs: 6 10*3/uL (ref 1.4–7.7)
Neutrophils Relative %: 69.8 % (ref 43.0–77.0)
Platelets: 431 10*3/uL — ABNORMAL HIGH (ref 150.0–400.0)
RBC: 4.13 Mil/uL (ref 3.87–5.11)
RDW: 14.8 % (ref 11.5–15.5)
WBC: 8.6 10*3/uL (ref 4.0–10.5)

## 2020-12-30 LAB — COMPREHENSIVE METABOLIC PANEL
ALT: 7 U/L (ref 0–35)
AST: 9 U/L (ref 0–37)
Albumin: 3.7 g/dL (ref 3.5–5.2)
Alkaline Phosphatase: 54 U/L (ref 39–117)
BUN: 12 mg/dL (ref 6–23)
CO2: 25 mEq/L (ref 19–32)
Calcium: 8.9 mg/dL (ref 8.4–10.5)
Chloride: 106 mEq/L (ref 96–112)
Creatinine, Ser: 0.89 mg/dL (ref 0.40–1.20)
GFR: 75.7 mL/min (ref >60.00)
Glucose, Bld: 78 mg/dL (ref 70–99)
Potassium: 3.3 mEq/L — ABNORMAL LOW (ref 3.5–5.1)
Sodium: 140 mEq/L (ref 135–145)
Total Bilirubin: 0.5 mg/dL (ref 0.2–1.2)
Total Protein: 6.1 g/dL (ref 6.0–8.3)

## 2020-12-30 LAB — LIPID PANEL
Cholesterol: 161 mg/dL (ref 0–200)
HDL: 44.3 mg/dL (ref >39.00)
LDL Cholesterol: 93 mg/dL (ref 0–99)
NonHDL: 117.01
Total CHOL/HDL Ratio: 4
Triglycerides: 118 mg/dL (ref 0.0–149.0)
VLDL: 23.6 mg/dL (ref 0.0–40.0)

## 2020-12-30 LAB — HEMOGLOBIN A1C: Hgb A1c MFr Bld: 5.4 % (ref 4.6–6.5)

## 2021-01-12 ENCOUNTER — Other Ambulatory Visit: Payer: Self-pay | Admitting: Family Medicine

## 2021-01-12 DIAGNOSIS — D649 Anemia, unspecified: Secondary | ICD-10-CM

## 2021-01-12 DIAGNOSIS — E876 Hypokalemia: Secondary | ICD-10-CM

## 2021-01-12 NOTE — Progress Notes (Signed)
See labs 

## 2021-06-27 ENCOUNTER — Telehealth: Payer: Self-pay | Admitting: Family Medicine

## 2021-06-27 NOTE — Telephone Encounter (Signed)
Pt called into cancel her appt for Friday. She states that she normally see's Dr. Carlota Raspberry once a year for an annual physical and would like to continue doing so if he is ok with it.   Pt also asked for a refill on the fluid pill and also a f/up on the cologuard order that was discussed at the June visit.   Please advise pt uses  [facility] on [address] in [place]

## 2021-06-27 NOTE — Telephone Encounter (Signed)
Pt is asking if she can continue yearly visits rather than follow ups, I can refill fluid pill if you are okay with yearly visit.   I have resent cologuard order

## 2021-06-27 NOTE — Telephone Encounter (Signed)
Depends on repeat labs - see lab notes. Does not appear she has returned for repeat labs yet.

## 2021-06-27 NOTE — Telephone Encounter (Signed)
Pt needs to return to the clinic for follow up lab work and then we can determine if yearly visit is acceptable or not

## 2021-06-27 NOTE — Telephone Encounter (Signed)
I have LM making pt aware of this, asked her to call back to schedule a lab appt.

## 2021-06-30 ENCOUNTER — Ambulatory Visit: Payer: 59 | Admitting: Family Medicine

## 2021-07-04 ENCOUNTER — Other Ambulatory Visit: Payer: Self-pay

## 2021-07-04 ENCOUNTER — Telehealth: Payer: Self-pay

## 2021-07-04 DIAGNOSIS — Z1211 Encounter for screening for malignant neoplasm of colon: Secondary | ICD-10-CM

## 2021-07-04 NOTE — Telephone Encounter (Signed)
Received fax stating they did not receive the info needed for a recent order. Did not have patient info on the fax, called to find which patient. I have placed the order in epic for cologuard.

## 2021-07-17 DIAGNOSIS — Z1231 Encounter for screening mammogram for malignant neoplasm of breast: Secondary | ICD-10-CM | POA: Diagnosis not present

## 2021-07-17 DIAGNOSIS — Z6835 Body mass index (BMI) 35.0-35.9, adult: Secondary | ICD-10-CM | POA: Diagnosis not present

## 2021-07-17 DIAGNOSIS — N760 Acute vaginitis: Secondary | ICD-10-CM | POA: Diagnosis not present

## 2021-07-17 DIAGNOSIS — Z130 Encounter for screening for diseases of the blood and blood-forming organs and certain disorders involving the immune mechanism: Secondary | ICD-10-CM | POA: Diagnosis not present

## 2021-07-17 DIAGNOSIS — Z1389 Encounter for screening for other disorder: Secondary | ICD-10-CM | POA: Diagnosis not present

## 2021-07-17 DIAGNOSIS — Z124 Encounter for screening for malignant neoplasm of cervix: Secondary | ICD-10-CM | POA: Diagnosis not present

## 2021-07-17 DIAGNOSIS — Z01419 Encounter for gynecological examination (general) (routine) without abnormal findings: Secondary | ICD-10-CM | POA: Diagnosis not present

## 2021-07-17 DIAGNOSIS — Z1151 Encounter for screening for human papillomavirus (HPV): Secondary | ICD-10-CM | POA: Diagnosis not present

## 2021-07-17 DIAGNOSIS — Z202 Contact with and (suspected) exposure to infections with a predominantly sexual mode of transmission: Secondary | ICD-10-CM | POA: Diagnosis not present

## 2021-07-18 DIAGNOSIS — Z124 Encounter for screening for malignant neoplasm of cervix: Secondary | ICD-10-CM | POA: Diagnosis not present

## 2021-07-18 DIAGNOSIS — Z1151 Encounter for screening for human papillomavirus (HPV): Secondary | ICD-10-CM | POA: Diagnosis not present

## 2021-07-18 LAB — HM PAP SMEAR: HM Pap smear: NORMAL

## 2021-07-24 ENCOUNTER — Encounter: Payer: Self-pay | Admitting: Family Medicine

## 2021-08-01 ENCOUNTER — Other Ambulatory Visit: Payer: Self-pay | Admitting: Obstetrics and Gynecology

## 2021-08-01 DIAGNOSIS — R928 Other abnormal and inconclusive findings on diagnostic imaging of breast: Secondary | ICD-10-CM

## 2021-08-09 ENCOUNTER — Ambulatory Visit
Admission: RE | Admit: 2021-08-09 | Discharge: 2021-08-09 | Disposition: A | Discharge status: Home / Self Care | Payer: 59 | Source: Ambulatory Visit | Attending: Obstetrics and Gynecology | Admitting: Obstetrics and Gynecology

## 2021-08-09 ENCOUNTER — Ambulatory Visit: Payer: 59

## 2021-08-09 DIAGNOSIS — R928 Other abnormal and inconclusive findings on diagnostic imaging of breast: Secondary | ICD-10-CM

## 2021-08-09 DIAGNOSIS — R922 Inconclusive mammogram: Secondary | ICD-10-CM | POA: Diagnosis not present

## 2022-02-21 ENCOUNTER — Other Ambulatory Visit: Payer: Self-pay | Admitting: Family Medicine

## 2022-02-21 DIAGNOSIS — I10 Essential (primary) hypertension: Secondary | ICD-10-CM

## 2022-03-02 DIAGNOSIS — Z0000 Encounter for general adult medical examination without abnormal findings: Secondary | ICD-10-CM | POA: Diagnosis not present

## 2022-03-02 DIAGNOSIS — I10 Essential (primary) hypertension: Secondary | ICD-10-CM | POA: Diagnosis not present

## 2022-03-02 DIAGNOSIS — J339 Nasal polyp, unspecified: Secondary | ICD-10-CM | POA: Diagnosis not present

## 2022-03-02 DIAGNOSIS — R5383 Other fatigue: Secondary | ICD-10-CM | POA: Diagnosis not present

## 2022-03-02 DIAGNOSIS — Z1329 Encounter for screening for other suspected endocrine disorder: Secondary | ICD-10-CM | POA: Diagnosis not present

## 2022-03-02 DIAGNOSIS — Z13220 Encounter for screening for lipoid disorders: Secondary | ICD-10-CM | POA: Diagnosis not present

## 2022-03-02 DIAGNOSIS — Z833 Family history of diabetes mellitus: Secondary | ICD-10-CM | POA: Diagnosis not present

## 2022-03-02 DIAGNOSIS — F419 Anxiety disorder, unspecified: Secondary | ICD-10-CM | POA: Diagnosis not present

## 2022-03-26 DIAGNOSIS — Z20822 Contact with and (suspected) exposure to covid-19: Secondary | ICD-10-CM | POA: Diagnosis not present

## 2022-03-26 DIAGNOSIS — J069 Acute upper respiratory infection, unspecified: Secondary | ICD-10-CM | POA: Diagnosis not present

## 2022-04-06 DIAGNOSIS — H52223 Regular astigmatism, bilateral: Secondary | ICD-10-CM | POA: Diagnosis not present

## 2022-05-09 DIAGNOSIS — N898 Other specified noninflammatory disorders of vagina: Secondary | ICD-10-CM | POA: Diagnosis not present

## 2022-05-09 DIAGNOSIS — N760 Acute vaginitis: Secondary | ICD-10-CM | POA: Diagnosis not present

## 2022-05-09 DIAGNOSIS — L292 Pruritus vulvae: Secondary | ICD-10-CM | POA: Diagnosis not present

## 2022-05-09 DIAGNOSIS — Z113 Encounter for screening for infections with a predominantly sexual mode of transmission: Secondary | ICD-10-CM | POA: Diagnosis not present

## 2022-05-29 DIAGNOSIS — L239 Allergic contact dermatitis, unspecified cause: Secondary | ICD-10-CM | POA: Diagnosis not present

## 2022-05-29 DIAGNOSIS — I10 Essential (primary) hypertension: Secondary | ICD-10-CM | POA: Diagnosis not present

## 2022-05-29 DIAGNOSIS — E669 Obesity, unspecified: Secondary | ICD-10-CM | POA: Diagnosis not present

## 2022-07-25 DIAGNOSIS — E668 Other obesity: Secondary | ICD-10-CM | POA: Diagnosis not present

## 2022-07-25 DIAGNOSIS — Z13228 Encounter for screening for other metabolic disorders: Secondary | ICD-10-CM | POA: Diagnosis not present

## 2022-07-25 DIAGNOSIS — I10 Essential (primary) hypertension: Secondary | ICD-10-CM | POA: Diagnosis not present

## 2022-07-25 DIAGNOSIS — Z713 Dietary counseling and surveillance: Secondary | ICD-10-CM | POA: Diagnosis not present

## 2022-07-25 DIAGNOSIS — Z6834 Body mass index (BMI) 34.0-34.9, adult: Secondary | ICD-10-CM | POA: Diagnosis not present

## 2022-07-25 DIAGNOSIS — Z7689 Persons encountering health services in other specified circumstances: Secondary | ICD-10-CM | POA: Diagnosis not present

## 2022-07-25 DIAGNOSIS — Z7182 Exercise counseling: Secondary | ICD-10-CM | POA: Diagnosis not present

## 2022-07-25 DIAGNOSIS — Z7189 Other specified counseling: Secondary | ICD-10-CM | POA: Diagnosis not present

## 2022-09-07 DIAGNOSIS — N925 Other specified irregular menstruation: Secondary | ICD-10-CM | POA: Diagnosis not present

## 2022-09-07 DIAGNOSIS — Z6832 Body mass index (BMI) 32.0-32.9, adult: Secondary | ICD-10-CM | POA: Diagnosis not present

## 2022-09-07 DIAGNOSIS — D251 Intramural leiomyoma of uterus: Secondary | ICD-10-CM | POA: Diagnosis not present

## 2022-09-07 DIAGNOSIS — Z01419 Encounter for gynecological examination (general) (routine) without abnormal findings: Secondary | ICD-10-CM | POA: Diagnosis not present

## 2022-09-07 DIAGNOSIS — Z113 Encounter for screening for infections with a predominantly sexual mode of transmission: Secondary | ICD-10-CM | POA: Diagnosis not present

## 2022-09-07 DIAGNOSIS — Z130 Encounter for screening for diseases of the blood and blood-forming organs and certain disorders involving the immune mechanism: Secondary | ICD-10-CM | POA: Diagnosis not present

## 2022-09-07 DIAGNOSIS — N762 Acute vulvitis: Secondary | ICD-10-CM | POA: Diagnosis not present

## 2022-12-27 ENCOUNTER — Telehealth: Payer: Self-pay

## 2022-12-27 NOTE — Telephone Encounter (Signed)
It sounds like she waited too long to get this addressed. The only thing we could possibly do on that timeline is to see her tomorrow and place metal patches with readings on Monday and Wednesday.   Malachi Bonds, MD [facility]

## 2022-12-27 NOTE — Telephone Encounter (Signed)
**  Patient is not an established patient** Patient called stating she spoke to the [place] office and was told she would need to schedule for metal testing. She then called [facility] and got me. The patient is having surgery on next Friday. She has had issues in the past with jewelry breaking her out and is having titanium put in during her surgery. She is wondering if any of our providers have seen any patients reject titanium during surgeries. Patient states she works for [employer] and can not take off the 3 days it takes for metal testing. She states she is just wanting some advice.  She denied scheduling an appointment.  Patient is requesting a call back this evening or in the morning so she can figure out what to do regarding her surgery.   Please Advise

## 2022-12-28 NOTE — Telephone Encounter (Signed)
Left detailed voicemail letting her know she will need to schedule a new patient appt for Metal testing.

## 2023-03-13 ENCOUNTER — Ambulatory Visit (INDEPENDENT_AMBULATORY_CARE_PROVIDER_SITE_OTHER): Payer: 59 | Admitting: Family Medicine

## 2023-03-13 VITALS — BP 134/72 | HR 81 | Temp 98.4°F | Ht 68.0 in | Wt 226.4 lb

## 2023-03-13 DIAGNOSIS — F410 Panic disorder [episodic paroxysmal anxiety] without agoraphobia: Secondary | ICD-10-CM

## 2023-03-13 DIAGNOSIS — F411 Generalized anxiety disorder: Secondary | ICD-10-CM

## 2023-03-13 DIAGNOSIS — Z0000 Encounter for general adult medical examination without abnormal findings: Secondary | ICD-10-CM | POA: Diagnosis not present

## 2023-03-13 DIAGNOSIS — I10 Essential (primary) hypertension: Secondary | ICD-10-CM

## 2023-03-13 DIAGNOSIS — D649 Anemia, unspecified: Secondary | ICD-10-CM | POA: Diagnosis not present

## 2023-03-13 DIAGNOSIS — Z131 Encounter for screening for diabetes mellitus: Secondary | ICD-10-CM | POA: Diagnosis not present

## 2023-03-13 DIAGNOSIS — Z1329 Encounter for screening for other suspected endocrine disorder: Secondary | ICD-10-CM

## 2023-03-13 DIAGNOSIS — Z1211 Encounter for screening for malignant neoplasm of colon: Secondary | ICD-10-CM

## 2023-03-13 MED ORDER — LOSARTAN POTASSIUM-HCTZ 50-12.5 MG PO TABS: 1.0000 | ORAL_TABLET | Freq: Every day | ORAL | 2 refills | Status: DC

## 2023-03-13 NOTE — Progress Notes (Signed)
Subjective:  Patient ID: Danielle Hawkins, female    DOB: May 21, 1971  Age: 52 y.o. MRN: [id number]  CC:  Chief Complaint  Patient presents with   Annual Exam    Pt is doing well, no concerns     HPI Danielle Hawkins presents for Annual Exam.  Last visit with me in June 2022, Prior to that time I had seen her in 2019. Denies barriers to care other than distance to office. Saw provider at [facility] in 2023, not in network.    Chronic anemia: With history of fibroids, previously treated with iron supplement.  Followed by GYN. Dr. Jackelyn Knife. Iron supplement as needed with heavier cycles.  Lab Results  Component Value Date   WBC 8.6 12/29/2020   HGB 10.2 (L) 12/29/2020   HCT 31.1 (L) 12/29/2020   MCV 75.4 (L) 12/29/2020   PLT 431.0 (H) 12/29/2020     Hypertension: Chlorthalidone 12.5 mg daily when last discussed in 2022. Now on losartan HCT 50/12.5mg  every day.  Home readings: controlled.  BP Readings from Last 3 Encounters:  03/13/23 134/72  12/29/20 122/60  06/21/20 120/70   Lab Results  Component Value Date   CREATININE 0.89 12/29/2020   History of situational anxiety Episodic Alprep when using the past.  Had reported difficulty with sertraline, Lexapro and side effects, difficulty with function and focus previously. Still infrequent need - only 2 in past 6 months. Controlled substance database (PDMP) reviewed. Last rx # 30 on on 09/10/22 - prior provider.   Hyperlipidemia: Hyperlipidemia in past, improved on more recent readings.  No recent labs, no current meds. Fasting today -6 hrs.  Lab Results  Component Value Date   CHOL 161 12/29/2020   HDL 44.30 12/29/2020   LDLCALC 93 12/29/2020   TRIG 118.0 12/29/2020   CHOLHDL 4 12/29/2020   Lab Results  Component Value Date   ALT 7 12/29/2020   AST 9 12/29/2020   ALKPHOS 54 12/29/2020   BILITOT 0.5 12/29/2020         03/13/2023    3:59 PM 06/13/2020    3:04 PM 06/08/2020    8:04 AM 06/03/2020    8:05  AM 12/30/2017    2:10 PM  Depression screen PHQ 2/9  Decreased Interest 0 0 0 0 0  Down, Depressed, Hopeless 0 0 0 0 0  PHQ - 2 Score 0 0 0 0 0  Altered sleeping 0      Tired, decreased energy 0      Change in appetite 0      Feeling bad or failure about yourself  0      Trouble concentrating 0      Moving slowly or fidgety/restless 0      Suicidal thoughts 0      PHQ-9 Score 0        Health Maintenance  Topic Date Due   Colonoscopy  Never done   Zoster Vaccines- Shingrix (1 of 2) Never done   MAMMOGRAM  12/29/2021   COVID-19 Vaccine (1 - 2023-24 season) Never done   INFLUENZA VACCINE  02/14/2023   PAP SMEAR-Modifier  07/18/2024   DTaP/Tdap/Td (2 - Td or Tdap) 08/09/2026   Hepatitis C Screening  Completed   HIV Screening  Completed   HPV VACCINES  Aged Out  Agrees to colonoscopy for colon CA screening.  Mammogram 08/09/21.  Pap testing at GYN.   Immunization History  Administered Date(s) Administered   Influenza-Unspecified 04/07/2018   Tdap 08/09/2016  Flu vaccine, covid, and shingles vaccine - declines.   No results found. Optho - in past year  Dental: ongoing care. With dentist - within past 6 months.   Alcohol:rare.   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   History Patient Active Problem List   Diagnosis Date Noted   On home oxygen therapy 06/03/2020   Dyspnea on exertion 06/03/2020   Blood in urine 06/03/2020   Hypertensive disorder 06/03/2020   Allergy    Anemia    Anxiety    Blood transfusion without reported diagnosis    Encounter for blood transfusion    Fibroid    HSV infection    Hypertension    Renal disorder    COVID-19 05/26/2020   Essential hypertension 06/04/2016   Acute non-recurrent sinusitis 06/04/2016   Past Medical History:  Diagnosis Date   Acute non-recurrent sinusitis 06/04/2016   Allergy    Anemia    Anxiety    Blood in urine 06/03/2020    Blood transfusion without reported diagnosis    COVID-19    Dyspnea on exertion 06/03/2020   Encounter for blood transfusion    Essential hypertension 06/04/2016   Fibroid    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   Hypertension    Hypertensive disorder 06/03/2020   On home oxygen therapy 06/03/2020   Renal disorder    kidney stones   Past Surgical History:  Procedure Laterality Date   GYNECOLOGIC CRYOSURGERY  early 20's   keloid removal     LITHOTRIPSY     Allergies  Allergen Reactions   Benzalkonium Chloride Other (See Comments)    Burning   Diflucan [Fluconazole] Hives, Itching and Swelling   Iopamidol Itching and Swelling   Neosporin [Neomycin-Bacitracin Zn-Polymyx] Other (See Comments)    "burning"   Niacin And Related Itching   Other Hives, Itching and Other (See Comments)    IV dye Allergic to metal. Burning reaction    Oxycodone-Acetaminophen Itching   Percocet [Oxycodone-Acetaminophen] Hives and Itching   Red Dye #40 (Allura Red) Hives and Itching   Tramadol Hives and Itching   Niacin Itching   Iodinated Contrast Media Itching and Hives   Latex Itching    Irration on site of contact   Prior to Admission medications   Medication Sig Start Date End Date Taking? Authorizing Provider  ALPRAZolam Prudy Feeler) 0.5 MG tablet Take 1 tablet (0.5 mg total) by mouth 2 (two) times daily as needed. for anxiety 12/29/20  Yes Shade Flood, MD  Biotin 1 MG CAPS Take 1 tablet by mouth daily.   Yes [provider]  losartan-hydrochlorothiazide (HYZAAR) 50-12.5 MG tablet Take 1 tablet by mouth daily. 03/06/23  Yes [provider]  ASHWAGANDHA PO Take 2 each by mouth daily. Patient not taking: Reported on 03/13/2023    [provider]  chlorthalidone (HYGROTON) 25 MG tablet Take 0.5 tablets (12.5 mg total) by mouth daily. Patient not taking: Reported on 03/13/2023 12/29/20   Shade Flood, MD  diphenhydrAMINE (BENADRYL) 25 mg  capsule Take 25 mg by mouth every 6 (six) hours as needed. As needed for allergic reaction. Patient not taking: Reported on 03/13/2023    [provider]  Ferrous Sulfate 140 (45 Fe) MG TBCR Take 1 tablet by mouth daily. Patient not taking: Reported on 03/13/2023    [provider]  ibuprofen (ADVIL) 200 MG tablet Take 200 mg by  mouth every 6 (six) hours as needed. 2 tablets as needed. Patient not taking: Reported on 03/13/2023    [provider]  Multiple Vitamin (MULTIVITAMIN WITH MINERALS) TABS tablet Take 1 tablet by mouth daily. Patient not taking: Reported on 03/13/2023    [provider]  zinc gluconate 50 MG tablet Take 50 mg by mouth daily. Patient not taking: Reported on 03/13/2023    [provider]   Social History   Socioeconomic History   Marital status: Single    Spouse name: Not on file   Number of children: Not on file   Years of education: Not on file   Highest education level: Not on file  Occupational History   Not on file  Tobacco Use   Smoking status: Former    Types: Cigarettes   Smokeless tobacco: Never   Tobacco comments:    socially  Substance and Sexual Activity   Alcohol use: Yes    Alcohol/week: 1.0 standard drink of alcohol    Types: 1 Glasses of wine per week    Comment: Rare   Drug use: No   Sexual activity: Yes    Birth control/protection: Pill  Other Topics Concern   Not on file  Social History Narrative   Not on file   Social Determinants of Health   Financial Resource Strain: Low Risk  (07/08/2019)   Received from [facility], [facility]   Overall Financial Resource Strain (CARDIA)    Difficulty of Paying Living Expenses: Not hard at all  Food Insecurity: No Food Insecurity (03/02/2022)   Received from [facility], [facility]   Hunger Vital Sign    Worried About Running Out of Food in the Last Year: Never true    Ran Out of Food in the Last Year: Never  true  Transportation Needs: No Transportation Needs (07/08/2019)   Received from [facility], [facility]   [facility] - Transportation    In the past 12 months, has lack of transportation kept you from medical appointments or from getting medications?: No    Lack of Transportation (Non-Medical): No  Physical Activity: Inactive (07/08/2019)   Received from [facility], [facility]   Exercise Vital Sign    Days of Exercise per Week: 0 days    Minutes of Exercise per Session: 0 min  Stress: Stress Concern Present (07/08/2019)   Received from [facility], [facility]   [employer] of Occupational Health - Occupational Stress Questionnaire    Feeling of Stress : Very much  Social Connections: Unknown (02/20/2022)   Received from [facility], [facility]   Social Network    Social Network: Not on file  Intimate Partner Violence: Unknown (02/20/2022)   Received from [facility], [facility]   HITS    Physically Hurt: Not on file    Insult or Talk Down To: Not on file    Threaten Physical Harm: Not on file    Scream or Curse: Not on file    Review of Systems 13 point review of systems per patient health survey noted.  Negative other than as indicated above or in HPI.    Objective:   Vitals:   03/13/23 1601  BP: 134/72  Pulse: 81  Temp: 98.4 F (36.9 C)  TempSrc: Temporal  SpO2: 100%  Weight: 226 lb 6.4 oz (102.7 kg)  Height: 5\' 8"  (1.727 m)  Physical Exam Constitutional:      Appearance: She is well-developed.  HENT:     Head: Normocephalic and atraumatic.     Right Ear: External ear normal.     Left Ear: External ear normal.  Eyes:     Conjunctiva/sclera: Conjunctivae normal.     Pupils: Pupils are equal, round, and reactive to light.  Neck:     Thyroid: No thyromegaly.  Cardiovascular:     Rate and Rhythm: Normal rate and regular rhythm.      Heart sounds: Normal heart sounds. No murmur heard. Pulmonary:     Effort: Pulmonary effort is normal. No respiratory distress.     Breath sounds: Normal breath sounds. No wheezing.  Abdominal:     General: Bowel sounds are normal.     Palpations: Abdomen is soft.     Tenderness: There is no abdominal tenderness.  Musculoskeletal:        General: No tenderness. Normal range of motion.     Cervical back: Normal range of motion and neck supple.  Lymphadenopathy:     Cervical: No cervical adenopathy.  Skin:    General: Skin is warm and dry.     Findings: No rash.  Neurological:     Mental Status: She is alert and oriented to person, place, and time.  Psychiatric:        Behavior: Behavior normal.        Thought Content: Thought content normal.      Assessment & Plan:  Danielle Hawkins is a 52 y.o. female . Annual physical exam  - -anticipatory guidance as below in AVS, screening labs above. Health maintenance items as above in HPI discussed/recommended as applicable.   Anemia, unspecified type - Plan: CBC with Differential/Platelet  Essential hypertension - Plan: Comprehensive metabolic panel, Lipid panel, TSH, losartan-hydrochlorothiazide (HYZAAR) 50-12.5 MG tablet    Stable, tolerating current regimen. Medications refilled. Labs pending as above.   Screening for thyroid disorder - Plan: TSH  Screening for diabetes mellitus - Plan: Hemoglobin A1c  Screening for malignant neoplasm of colon - Plan: Ambulatory referral to Gastroenterology   Meds ordered this encounter  Medications   losartan-hydrochlorothiazide (HYZAAR) 50-12.5 MG tablet    Sig: Take 1 tablet by mouth daily.    Dispense:  90 tablet    Refill:  2   Patient Instructions  I will refer you for colonoscopy.  Mammogram yearly is an option - can be discussed with your gynecologist.  No med changes today. I will let you know if any lab concerns.  Take care.   Preventive Care 86-64 Years Old, Female Preventive  care refers to lifestyle choices and visits with your health care provider that can promote health and wellness. Preventive care visits are also called wellness exams. What can I expect for my preventive care visit? Counseling Your health care provider may ask you questions about your: Medical history, including: Past medical problems. Family medical history. Pregnancy history. Current health, including: Menstrual cycle. Method of birth control. Emotional well-being. Home life and relationship well-being. Sexual activity and sexual health. Lifestyle, including: Alcohol, nicotine or tobacco, and drug use. Access to firearms. Diet, exercise, and sleep habits. Work and work Astronomer. Sunscreen use. Safety issues such as seatbelt and bike helmet use. Physical exam Your health care provider will check your: Height and weight. These may be used to calculate your BMI (body mass index). BMI is a measurement that tells if you are at a healthy weight. Waist circumference. This measures  the distance around your waistline. This measurement also tells if you are at a healthy weight and may help predict your risk of certain diseases, such as type 2 diabetes and high blood pressure. Heart rate and blood pressure. Body temperature. Skin for abnormal spots. What immunizations do I need?  Vaccines are usually given at various ages, according to a schedule. Your health care provider will recommend vaccines for you based on your age, medical history, and lifestyle or other factors, such as travel or where you work. What tests do I need? Screening Your health care provider may recommend screening tests for certain conditions. This may include: Lipid and cholesterol levels. Diabetes screening. This is done by checking your blood sugar (glucose) after you have not eaten for a while (fasting). Pelvic exam and Pap test. Hepatitis B test. Hepatitis C test. HIV (human immunodeficiency virus)  test. STI (sexually transmitted infection) testing, if you are at risk. Lung cancer screening. Colorectal cancer screening. Mammogram. Talk with your health care provider about when you should start having regular mammograms. This may depend on whether you have a family history of breast cancer. BRCA-related cancer screening. This may be done if you have a family history of breast, ovarian, tubal, or peritoneal cancers. Bone density scan. This is done to screen for osteoporosis. Talk with your health care provider about your test results, treatment options, and if necessary, the need for more tests. Follow these instructions at home: Eating and drinking  Eat a diet that includes fresh fruits and vegetables, whole grains, lean protein, and low-fat dairy products. Take vitamin and mineral supplements as recommended by your health care provider. Do not drink alcohol if: Your health care provider tells you not to drink. You are pregnant, may be pregnant, or are planning to become pregnant. If you drink alcohol: Limit how much you have to 0-1 drink a day. Know how much alcohol is in your drink. In the [place] one drink equals one 12 oz bottle of beer (355 mL), one 5 oz glass of wine (148 mL), or one 1 oz glass of hard liquor (44 mL). Lifestyle Brush your teeth every morning and night with fluoride toothpaste. Floss one time each day. Exercise for at least 30 minutes 5 or more days each week. Do not use any products that contain nicotine or tobacco. These products include cigarettes, chewing tobacco, and vaping devices, such as e-cigarettes. If you need help quitting, ask your health care provider. Do not use drugs. If you are sexually active, practice safe sex. Use a condom or other form of protection to prevent STIs. If you do not wish to become pregnant, use a form of birth control. If you plan to become pregnant, see your health care provider for a prepregnancy visit. Take aspirin only as told  by your health care provider. Make sure that you understand how much to take and what form to take. Work with your health care provider to find out whether it is safe and beneficial for you to take aspirin daily. Find healthy ways to manage stress, such as: Meditation, yoga, or listening to music. Journaling. Talking to a trusted person. Spending time with friends and family. Minimize exposure to UV radiation to reduce your risk of skin cancer. Safety Always wear your seat belt while driving or riding in a vehicle. Do not drive: If you have been drinking alcohol. Do not ride with someone who has been drinking. When you are tired or distracted. While texting. If you have  been using any mind-altering substances or drugs. Wear a helmet and other protective equipment during sports activities. If you have firearms in your house, make sure you follow all gun safety procedures. Seek help if you have been physically or sexually abused. What's next? Visit your health care provider once a year for an annual wellness visit. Ask your health care provider how often you should have your eyes and teeth checked. Stay up to date on all vaccines. This information is not intended to replace advice given to you by your health care provider. Make sure you discuss any questions you have with your health care provider. Document Revised: 12/28/2020 Document Reviewed: 12/28/2020 Elsevier Patient Education  2024 Elsevier Inc.     Signed,   Meredith Staggers, MD [facility], [facility] 03/13/23 4:55 PM

## 2023-03-13 NOTE — Patient Instructions (Addendum)
I will refer you for colonoscopy.  Mammogram yearly is an option - can be discussed with your gynecologist.  No med changes today. I will let you know if any lab concerns.  Take care.   Preventive Care 18-52 Years Old, Female Preventive care refers to lifestyle choices and visits with your health care provider that can promote health and wellness. Preventive care visits are also called wellness exams. What can I expect for my preventive care visit? Counseling Your health care provider may ask you questions about your: Medical history, including: Past medical problems. Family medical history. Pregnancy history. Current health, including: Menstrual cycle. Method of birth control. Emotional well-being. Home life and relationship well-being. Sexual activity and sexual health. Lifestyle, including: Alcohol, nicotine or tobacco, and drug use. Access to firearms. Diet, exercise, and sleep habits. Work and work Astronomer. Sunscreen use. Safety issues such as seatbelt and bike helmet use. Physical exam Your health care provider will check your: Height and weight. These may be used to calculate your BMI (body mass index). BMI is a measurement that tells if you are at a healthy weight. Waist circumference. This measures the distance around your waistline. This measurement also tells if you are at a healthy weight and may help predict your risk of certain diseases, such as type 2 diabetes and high blood pressure. Heart rate and blood pressure. Body temperature. Skin for abnormal spots. What immunizations do I need?  Vaccines are usually given at various ages, according to a schedule. Your health care provider will recommend vaccines for you based on your age, medical history, and lifestyle or other factors, such as travel or where you work. What tests do I need? Screening Your health care provider may recommend screening tests for certain conditions. This may include: Lipid and  cholesterol levels. Diabetes screening. This is done by checking your blood sugar (glucose) after you have not eaten for a while (fasting). Pelvic exam and Pap test. Hepatitis B test. Hepatitis C test. HIV (human immunodeficiency virus) test. STI (sexually transmitted infection) testing, if you are at risk. Lung cancer screening. Colorectal cancer screening. Mammogram. Talk with your health care provider about when you should start having regular mammograms. This may depend on whether you have a family history of breast cancer. BRCA-related cancer screening. This may be done if you have a family history of breast, ovarian, tubal, or peritoneal cancers. Bone density scan. This is done to screen for osteoporosis. Talk with your health care provider about your test results, treatment options, and if necessary, the need for more tests. Follow these instructions at home: Eating and drinking  Eat a diet that includes fresh fruits and vegetables, whole grains, lean protein, and low-fat dairy products. Take vitamin and mineral supplements as recommended by your health care provider. Do not drink alcohol if: Your health care provider tells you not to drink. You are pregnant, may be pregnant, or are planning to become pregnant. If you drink alcohol: Limit how much you have to 0-1 drink a day. Know how much alcohol is in your drink. In the [place] one drink equals one 12 oz bottle of beer (355 mL), one 5 oz glass of wine (148 mL), or one 1 oz glass of hard liquor (44 mL). Lifestyle Brush your teeth every morning and night with fluoride toothpaste. Floss one time each day. Exercise for at least 30 minutes 5 or more days each week. Do not use any products that contain nicotine or tobacco. These products include cigarettes, chewing  tobacco, and vaping devices, such as e-cigarettes. If you need help quitting, ask your health care provider. Do not use drugs. If you are sexually active, practice safe sex.  Use a condom or other form of protection to prevent STIs. If you do not wish to become pregnant, use a form of birth control. If you plan to become pregnant, see your health care provider for a prepregnancy visit. Take aspirin only as told by your health care provider. Make sure that you understand how much to take and what form to take. Work with your health care provider to find out whether it is safe and beneficial for you to take aspirin daily. Find healthy ways to manage stress, such as: Meditation, yoga, or listening to music. Journaling. Talking to a trusted person. Spending time with friends and family. Minimize exposure to UV radiation to reduce your risk of skin cancer. Safety Always wear your seat belt while driving or riding in a vehicle. Do not drive: If you have been drinking alcohol. Do not ride with someone who has been drinking. When you are tired or distracted. While texting. If you have been using any mind-altering substances or drugs. Wear a helmet and other protective equipment during sports activities. If you have firearms in your house, make sure you follow all gun safety procedures. Seek help if you have been physically or sexually abused. What's next? Visit your health care provider once a year for an annual wellness visit. Ask your health care provider how often you should have your eyes and teeth checked. Stay up to date on all vaccines. This information is not intended to replace advice given to you by your health care provider. Make sure you discuss any questions you have with your health care provider. Document Revised: 12/28/2020 Document Reviewed: 12/28/2020 Elsevier Patient Education  2024 [employer].

## 2023-03-14 LAB — CBC WITH DIFFERENTIAL/PLATELET
Basophils Absolute: 0.1 10*3/uL (ref 0.0–0.1)
Basophils Relative: 1.6 % (ref 0.0–3.0)
Eosinophils Absolute: 0.1 10*3/uL (ref 0.0–0.7)
Eosinophils Relative: 2.2 % (ref 0.0–5.0)
HCT: 31.8 % — ABNORMAL LOW (ref 36.0–46.0)
Hemoglobin: 10 g/dL — ABNORMAL LOW (ref 12.0–15.0)
Lymphocytes Relative: 23.9 % (ref 12.0–46.0)
Lymphs Abs: 1.4 10*3/uL (ref 0.7–4.0)
MCHC: 31.6 g/dL (ref 30.0–36.0)
MCV: 80.4 fl (ref 78.0–100.0)
Monocytes Absolute: 0.4 10*3/uL (ref 0.1–1.0)
Monocytes Relative: 7.1 % (ref 3.0–12.0)
Neutro Abs: 3.9 10*3/uL (ref 1.4–7.7)
Neutrophils Relative %: 65.2 % (ref 43.0–77.0)
Platelets: 446 10*3/uL — ABNORMAL HIGH (ref 150.0–400.0)
RBC: 3.95 Mil/uL (ref 3.87–5.11)
RDW: 14.9 % (ref 11.5–15.5)
WBC: 6 10*3/uL (ref 4.0–10.5)

## 2023-03-14 LAB — COMPREHENSIVE METABOLIC PANEL
ALT: 8 U/L (ref 0–35)
AST: 11 U/L (ref 0–37)
Albumin: 3.9 g/dL (ref 3.5–5.2)
Alkaline Phosphatase: 72 U/L (ref 39–117)
BUN: 14 mg/dL (ref 6–23)
CO2: 26 meq/L (ref 19–32)
Calcium: 9.1 mg/dL (ref 8.4–10.5)
Chloride: 104 meq/L (ref 96–112)
Creatinine, Ser: 0.94 mg/dL (ref 0.40–1.20)
GFR: 69.81 mL/min (ref >60.00)
Glucose, Bld: 75 mg/dL (ref 70–99)
Potassium: 3.5 meq/L (ref 3.5–5.1)
Sodium: 140 meq/L (ref 135–145)
Total Bilirubin: 0.4 mg/dL (ref 0.2–1.2)
Total Protein: 6.5 g/dL (ref 6.0–8.3)

## 2023-03-14 LAB — LIPID PANEL
Cholesterol: 179 mg/dL (ref 0–200)
HDL: 47.3 mg/dL (ref >39.00)
LDL Cholesterol: 115 mg/dL — ABNORMAL HIGH (ref 0–99)
NonHDL: 131.74
Total CHOL/HDL Ratio: 4
Triglycerides: 86 mg/dL (ref 0.0–149.0)
VLDL: 17.2 mg/dL (ref 0.0–40.0)

## 2023-03-14 LAB — HEMOGLOBIN A1C: Hgb A1c MFr Bld: 5.2 % (ref 4.6–6.5)

## 2023-03-14 LAB — TSH: TSH: 0.74 u[IU]/mL (ref 0.35–5.50)

## 2023-03-15 ENCOUNTER — Telehealth: Payer: Self-pay | Admitting: Family Medicine

## 2023-03-15 NOTE — Telephone Encounter (Signed)
Please advise result when you have time so I may call the patient with those comments

## 2023-03-15 NOTE — Telephone Encounter (Signed)
Caller name: Deboah Jilek  On [place]?: Yes  Call back number: [phone] (mobile)  Provider they see: Shade Flood, MD  Reason for call:  Pt was calling asking if her lab results are in. I told her someone would call her to give her the results.

## 2023-03-19 ENCOUNTER — Telehealth: Payer: Self-pay

## 2023-03-19 NOTE — Telephone Encounter (Signed)
Thyroid test normal. Electrolytes normal. Hemoglobin overall stable from last check and platelets elevated but similar to prior test. Daily iron supplement and recheck in 6 weeks for repeat CBC, and iron panel - diagnosis anemia, lab visit ok.  Cholesterol up slightly. 3 month blood sugar test ok. Watch diet and recheck cholesterol in 6 months.

## 2023-03-20 NOTE — Telephone Encounter (Signed)
I understand her concerns.  She is still anemic with hemoglobin 10.0, I have recommended she increase her iron frequency to see if that level will improve.  My understanding at her physical was that she only takes the iron supplement as needed with heavier menstrual cycles.  Elevated platelets seen previously but they were slightly higher recently, which would be another reason for recheck labs.  If she would prefer to have the anemia followed by OB/GYN that is fine as well as anemia thought to be due to menstrual bleeding.  Let me know what she would like to do.  Thanks!

## 2023-03-20 NOTE — Telephone Encounter (Signed)
Pt reports she would like to know why this 6 week check would be necessary . Pt would like some clarity on what you could be looking for? What she can do different. Pt does not  want to pay for more labs visits. Pt reports she takes her iron supplement only taken around time of menstrual cycle. Pt would like a phone call about this.

## 2023-03-21 NOTE — Telephone Encounter (Signed)
Pt wants to know why the platelets may be elevated, if there is anything she can do to improve this, and if there is something we can test for that is causing this increase.   Please advise

## 2023-03-22 NOTE — Telephone Encounter (Signed)
Pt has been advised and will call back to schedule lab

## 2023-03-22 NOTE — Telephone Encounter (Signed)
Elevated platelets could be from the iron deficiency anemia, as that is a common cause.  Nothing concerning, but should still keep an eye on readings and make sure they are not increasing.  Again the initial approach would be to try iron supplement a little more frequently and repeat testing.  We certainly can schedule a virtual visit to talk through this further if needed or I am happy to refer her to hematologist if she would prefer.  Let me know if I can help further.  Thanks.

## 2023-04-05 ENCOUNTER — Other Ambulatory Visit: Payer: Self-pay

## 2023-04-05 DIAGNOSIS — D649 Anemia, unspecified: Secondary | ICD-10-CM

## 2023-04-05 NOTE — Telephone Encounter (Signed)
Noted - cbc, iron study orders from earlier today. Will watch for results. Let me know if other questions in the meantime.

## 2023-04-05 NOTE — Telephone Encounter (Signed)
Patient wanted you to know she has started the iron pills as recommended during last results, needs to get lab visit for after being on Iron supplement

## 2023-04-05 NOTE — Telephone Encounter (Signed)
She would like Neva Seat CMA to call her - she has started taking iron supplements and wants Neva Seat to know that

## 2023-04-16 ENCOUNTER — Encounter: Payer: Self-pay | Admitting: Internal Medicine

## 2023-04-29 ENCOUNTER — Other Ambulatory Visit: Payer: Self-pay

## 2023-04-29 ENCOUNTER — Telehealth: Payer: Self-pay | Admitting: Family Medicine

## 2023-04-29 NOTE — Telephone Encounter (Signed)
Spoke with pt and she is going to call the number for on her chart for this. Per her its showing her last annual was 07/17/2021 She will call us back after speaking with them if any other issues occur .

## 2023-04-29 NOTE — Telephone Encounter (Signed)
Pt states the physical that she had is not showing on the [facility] system as of yet even though I can see that she had it on 03/13/23. She would like for a nurse to return her call [phone]

## 2023-05-31 ENCOUNTER — Ambulatory Visit (AMBULATORY_SURGERY_CENTER): Payer: 59

## 2023-05-31 VITALS — Ht 69.0 in | Wt 230.0 lb

## 2023-05-31 DIAGNOSIS — Z1211 Encounter for screening for malignant neoplasm of colon: Secondary | ICD-10-CM

## 2023-05-31 MED ORDER — NA SULFATE-K SULFATE-MG SULF 17.5-3.13-1.6 GM/177ML PO SOLN: 1.0000 | Freq: Once | ORAL | 0 refills | Status: AC

## 2023-05-31 NOTE — Progress Notes (Signed)
Pre visit completed via phone call; Patient verified name, DOB, and address; No egg or soy allergy known to patient;  No issues known to pt with past sedation with any surgeries or procedures; Patient denies ever being told they had issues or difficulty with intubation;  No FH of Malignant Hyperthermia; Pt is not on diet pills; Pt is not on home 02;  Pt is not on blood thinners;  Pt denies issues with constipation;  No A fib or A flutter; Have any cardiac testing pending--NO Insurance verified during PV appt--- [employer] Pt can ambulate without assistance;  Pt denies use of chewing tobacco; Discussed diabetic/weight loss medication holds; Discussed NSAID holds; Checked BMI to be less than 50; Pt instructed to use [employer] or GoodRx for a price reduction on prep;  Patient's chart reviewed by Cathlyn Parsons CNRA prior to previsit and patient appropriate for the [facility]; Pre visit completed and red dot placed by patient's name on their procedure day (on provider's schedule);   Instructions printed and mailed to patient per her request;

## 2023-06-21 ENCOUNTER — Encounter: Payer: 59 | Admitting: Internal Medicine

## 2023-06-28 ENCOUNTER — Other Ambulatory Visit (INDEPENDENT_AMBULATORY_CARE_PROVIDER_SITE_OTHER): Payer: 59

## 2023-06-28 DIAGNOSIS — D649 Anemia, unspecified: Secondary | ICD-10-CM | POA: Diagnosis not present

## 2023-06-28 LAB — CBC WITH DIFFERENTIAL/PLATELET
Basophils Absolute: 0.1 10*3/uL (ref 0.0–0.1)
Basophils Relative: 1.2 % (ref 0.0–3.0)
Eosinophils Absolute: 0.1 10*3/uL (ref 0.0–0.7)
Eosinophils Relative: 2.7 % (ref 0.0–5.0)
HCT: 37.8 % (ref 36.0–46.0)
Hemoglobin: 12.3 g/dL (ref 12.0–15.0)
Lymphocytes Relative: 25 % (ref 12.0–46.0)
Lymphs Abs: 1.1 10*3/uL (ref 0.7–4.0)
MCHC: 32.6 g/dL (ref 30.0–36.0)
MCV: 80.2 fL (ref 78.0–100.0)
Monocytes Absolute: 0.4 10*3/uL (ref 0.1–1.0)
Monocytes Relative: 8.1 % (ref 3.0–12.0)
Neutro Abs: 2.8 10*3/uL (ref 1.4–7.7)
Neutrophils Relative %: 63 % (ref 43.0–77.0)
Platelets: 308 10*3/uL (ref 150.0–400.0)
RBC: 4.7 Mil/uL (ref 3.87–5.11)
RDW: 16.1 % — ABNORMAL HIGH (ref 11.5–15.5)
WBC: 4.4 10*3/uL (ref 4.0–10.5)

## 2023-06-29 LAB — IRON,TIBC AND FERRITIN PANEL
%SAT: 21 % (ref 16–45)
Ferritin: 17 ng/mL (ref 16–232)
Iron: 65 ug/dL (ref 45–160)
TIBC: 317 ug/dL (ref 250–450)

## 2023-07-08 ENCOUNTER — Encounter: Payer: Self-pay | Admitting: Internal Medicine

## 2023-07-11 ENCOUNTER — Telehealth: Payer: Self-pay | Admitting: Internal Medicine

## 2023-07-11 NOTE — Telephone Encounter (Signed)
Inbound call from patient stating she has questions regarding 12/31 prep instructions. Please advise, thank you.

## 2023-07-12 NOTE — Telephone Encounter (Signed)
RN attempted to call patient back regarding questions about prep; received voice mail; left message for patient to call back to let us know her specific questions.

## 2023-07-16 ENCOUNTER — Encounter: Payer: Self-pay | Admitting: Internal Medicine

## 2023-07-16 ENCOUNTER — Ambulatory Visit: Payer: 59 | Admitting: Internal Medicine

## 2023-07-16 VITALS — BP 135/92 | HR 61 | Temp 98.1°F | Resp 16 | Ht 69.0 in | Wt 230.0 lb

## 2023-07-16 DIAGNOSIS — K5730 Diverticulosis of large intestine without perforation or abscess without bleeding: Secondary | ICD-10-CM

## 2023-07-16 DIAGNOSIS — F419 Anxiety disorder, unspecified: Secondary | ICD-10-CM | POA: Diagnosis not present

## 2023-07-16 DIAGNOSIS — Z1211 Encounter for screening for malignant neoplasm of colon: Secondary | ICD-10-CM | POA: Diagnosis not present

## 2023-07-16 DIAGNOSIS — D120 Benign neoplasm of cecum: Secondary | ICD-10-CM | POA: Diagnosis not present

## 2023-07-16 DIAGNOSIS — I10 Essential (primary) hypertension: Secondary | ICD-10-CM | POA: Diagnosis not present

## 2023-07-16 DIAGNOSIS — D128 Benign neoplasm of rectum: Secondary | ICD-10-CM

## 2023-07-16 DIAGNOSIS — D122 Benign neoplasm of ascending colon: Secondary | ICD-10-CM

## 2023-07-16 MED ORDER — SODIUM CHLORIDE 0.9 % IV SOLN: 500.0000 mL | INTRAVENOUS | Status: DC

## 2023-07-16 NOTE — Progress Notes (Signed)
 HISTORY OF PRESENT ILLNESS:  Danielle Hawkins is a 52 y.o. female sent for screening colonoscopy.  No complaints  REVIEW OF SYSTEMS:  All non-GI ROS negative except for  Past Medical History:  Diagnosis Date   Acute non-recurrent sinusitis 06/04/2016   Allergy    Anemia    Anxiety    Blood in urine 06/03/2020   Blood transfusion without reported diagnosis    COVID-19    Dyspnea on exertion 06/03/2020   Encounter for blood transfusion    Essential hypertension 06/04/2016   Fibroid    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   Hypertension    Renal disorder    kidney stones    Past Surgical History:  Procedure Laterality Date   GYNECOLOGIC CRYOSURGERY  early 20's   keloid removal     LITHOTRIPSY      Social History Danielle Hawkins  reports that she has quit smoking. Her smoking use included cigarettes. She has never used smokeless tobacco. She reports that she does not currently use alcohol. She reports that she does not use drugs.  family history includes Breast cancer (age of onset: 71) in her maternal aunt; Colon cancer in her maternal aunt; Colon polyps in her maternal aunt; Diabetes in her mother; Heart disease in her mother; Hyperlipidemia in her mother; Hypertension in her father and mother.  Allergies  Allergen Reactions   Benzalkonium Chloride Other (See Comments)    Burning   Diflucan [Fluconazole] Hives, Itching and Swelling   Iopamidol Itching and Swelling   Neosporin [Neomycin-Bacitracin Zn-Polymyx] Other (See Comments)    burning   Niacin And Related Itching   Other Hives, Itching and Other (See Comments)    IV dye Allergic to metal. Burning reaction    Oxycodone-Acetaminophen Itching and Hives   Percocet [Oxycodone-Acetaminophen] Hives and Itching   Red Dye #40 (Allura Red) Hives and Itching   Tramadol Hives and Itching   Niacin Itching   Iodinated Contrast Media Itching and Hives   Latex Itching    Irration on site of contact        PHYSICAL EXAMINATION: Vital signs: BP (!) 153/92   Pulse 71   Temp 98.1 F (36.7 C)   Ht 5' 9 (1.753 m)   Wt 230 lb (104.3 kg)   LMP  (LMP Unknown)   SpO2 97%   BMI 33.97 kg/m  General: Well-developed, well-nourished, no acute distress HEENT: Sclerae are anicteric, conjunctiva pink. Oral mucosa intact Lungs: Clear Heart: Regular Abdomen: soft, nontender, nondistended, no obvious ascites, no peritoneal signs, normal bowel sounds. No organomegaly. Extremities: No edema Psychiatric: alert and oriented x3. Cooperative     ASSESSMENT:  Colon cancer screening   PLAN:   Screening colonoscopy

## 2023-07-16 NOTE — Progress Notes (Signed)
 Called to room to assist during endoscopic procedure.  Patient ID and intended procedure confirmed with present staff. Received instructions for my participation in the procedure from the performing physician.

## 2023-07-16 NOTE — Progress Notes (Signed)
 Sedate, gd SR, tolerated procedure well, VSS, report to RN

## 2023-07-16 NOTE — Op Note (Signed)
 [facility] Patient Name: Danielle Hawkins Procedure Date: 07/16/2023 9:39 AM MRN: [id number] Endoscopist: Norleen SAILOR. Abran , MD, [id number] Age: 52 Referring MD:  Date of Birth: 26-Nov-1970 Gender: Female Account #: [id number] Procedure:                Colonoscopy with cold snare polypectomy x 3 Indications:              Screening for colorectal malignant neoplasm Medicines:                Monitored Anesthesia Care Procedure:                Pre-Anesthesia Assessment:                           - Prior to the procedure, a History and Physical                            was performed, and patient medications and                            allergies were reviewed. The patient's tolerance of                            previous anesthesia was also reviewed. The risks                            and benefits of the procedure and the sedation                            options and risks were discussed with the patient.                            All questions were answered, and informed consent                            was obtained. Prior Anticoagulants: The patient has                            taken no anticoagulant or antiplatelet agents. ASA                            Grade Assessment: II - A patient with mild systemic                            disease. After reviewing the risks and benefits,                            the patient was deemed in satisfactory condition to                            undergo the procedure.                           After obtaining informed consent, the colonoscope  was passed under direct vision. Throughout the                            procedure, the patient's blood pressure, pulse, and                            oxygen  saturations were monitored continuously. The                            CF [id number] #[id number] was introduced through the anus                            and advanced to the the cecum, identified by                             appendiceal orifice and ileocecal valve. The                            ileocecal valve, appendiceal orifice, and rectum                            were photographed. The quality of the bowel                            preparation was excellent. The colonoscopy was                            performed without difficulty. The patient tolerated                            the procedure well. The bowel preparation used was                            SUPREP via split dose instruction. Scope In: 9:45:39 AM Scope Out: 10:03:20 AM Scope Withdrawal Time: 0 hours 13 minutes 52 seconds  Total Procedure Duration: 0 hours 17 minutes 41 seconds  Findings:                 Three polyps were found in the rectum, ascending                            colon and cecum. The polyps were 4 to 7 mm in size.                            These polyps were removed with a cold snare.                            Resection and retrieval were complete.                           Multiple diverticula were found in the left colon.                           The exam was  otherwise without abnormality on                            direct and retroflexion views. Complications:            No immediate complications. Estimated blood loss:                            None. Estimated Blood Loss:     Estimated blood loss: none. Impression:               - Three 4 to 7 mm polyps in the rectum, in the                            ascending colon and in the cecum, removed with a                            cold snare. Resected and retrieved.                           - Diverticulosis in the left colon.                           - The examination was otherwise normal on direct                            and retroflexion views. Recommendation:           - Repeat colonoscopy in 3 years for surveillance.                           - Patient has a contact number available for                            emergencies. The signs and  symptoms of potential                            delayed complications were discussed with the                            patient. Return to normal activities tomorrow.                            Written discharge instructions were provided to the                            patient.                           - Resume previous diet.                           - Continue present medications.                           - Await pathology results. Norleen SAILOR. Abran, MD 07/16/2023 10:10:31 AM This report has been  signed electronically.

## 2023-07-16 NOTE — Patient Instructions (Addendum)
 Resume prior medications and diet.  Follow up colonoscopy in 3 years.  Handout provided on polyps.  YOU HAD AN ENDOSCOPIC PROCEDURE TODAY AT THE [facility]:   Refer to the procedure report that was given to you for any specific questions about what was found during the examination.  If the procedure report does not answer your questions, please call your gastroenterologist to clarify.  If you requested that your care partner not be given the details of your procedure findings, then the procedure report has been included in a sealed envelope for you to review at your convenience later.  YOU SHOULD EXPECT: Some feelings of bloating in the abdomen. Passage of more gas than usual.  Walking can help get rid of the air that was put into your GI tract during the procedure and reduce the bloating. If you had a lower endoscopy (such as a colonoscopy or flexible sigmoidoscopy) you may notice spotting of blood in your stool or on the toilet paper. If you underwent a bowel prep for your procedure, you may not have a normal bowel movement for a few days.  Please Note:  You might notice some irritation and congestion in your nose or some drainage.  This is from the oxygen  used during your procedure.  There is no need for concern and it should clear up in a day or so.  SYMPTOMS TO REPORT IMMEDIATELY:  Following lower endoscopy (colonoscopy or flexible sigmoidoscopy):  Excessive amounts of blood in the stool  Significant tenderness or worsening of abdominal pains  Swelling of the abdomen that is new, acute  Fever of 100F or higher  For urgent or emergent issues, a gastroenterologist can be reached at any hour by calling [phone]. Do not use MyChart messaging for urgent concerns.    DIET:  We do recommend a small meal at first, but then you may proceed to your regular diet.  Drink plenty of fluids but you should avoid alcoholic beverages for 24 hours.  ACTIVITY:  You should plan to take it  easy for the rest of today and you should NOT DRIVE or use heavy machinery until tomorrow (because of the sedation medicines used during the test).    FOLLOW UP: Our staff will call the number listed on your records the next business day following your procedure.  We will call around 7:15- 8:00 am to check on you and address any questions or concerns that you may have regarding the information given to you following your procedure. If we do not reach you, we will leave a message.     If any biopsies were taken you will be contacted by phone or by letter within the next 1-3 weeks.  Please call us  at [phone] if you have not heard about the biopsies in 3 weeks.    SIGNATURES/CONFIDENTIALITY: You and/or your care partner have signed paperwork which will be entered into your electronic medical record.  These signatures attest to the fact that that the information above on your After Visit Summary has been reviewed and is understood.  Full responsibility of the confidentiality of this discharge information lies with you and/or your care-partner.

## 2023-07-19 ENCOUNTER — Telehealth: Payer: Self-pay | Admitting: *Deleted

## 2023-07-19 NOTE — Telephone Encounter (Signed)
Post procedure follow up phone call. No answer at number given.  Left message on voicemail.  

## 2023-07-22 LAB — SURGICAL PATHOLOGY

## 2023-07-23 ENCOUNTER — Encounter: Payer: Self-pay | Admitting: Internal Medicine

## 2023-09-08 IMAGING — MG MM DIGITAL DIAGNOSTIC UNILAT*L* W/ TOMO W/ CAD
4 series · 4 of 12 positions shown · non-contrast
Comparison: Previous exam(s).

CLINICAL DATA: 50-year-old female recalled from screening mammogram
dated 07/17/2021 for a possible left breast asymmetry.

EXAM:
DIGITAL DIAGNOSTIC UNILATERAL LEFT MAMMOGRAM WITH TOMOSYNTHESIS AND
CAD
TECHNIQUE: Left digital diagnostic mammography and breast tomosynthesis was
performed. The images were evaluated with computer-aided detection.

[L CC synth-2D]
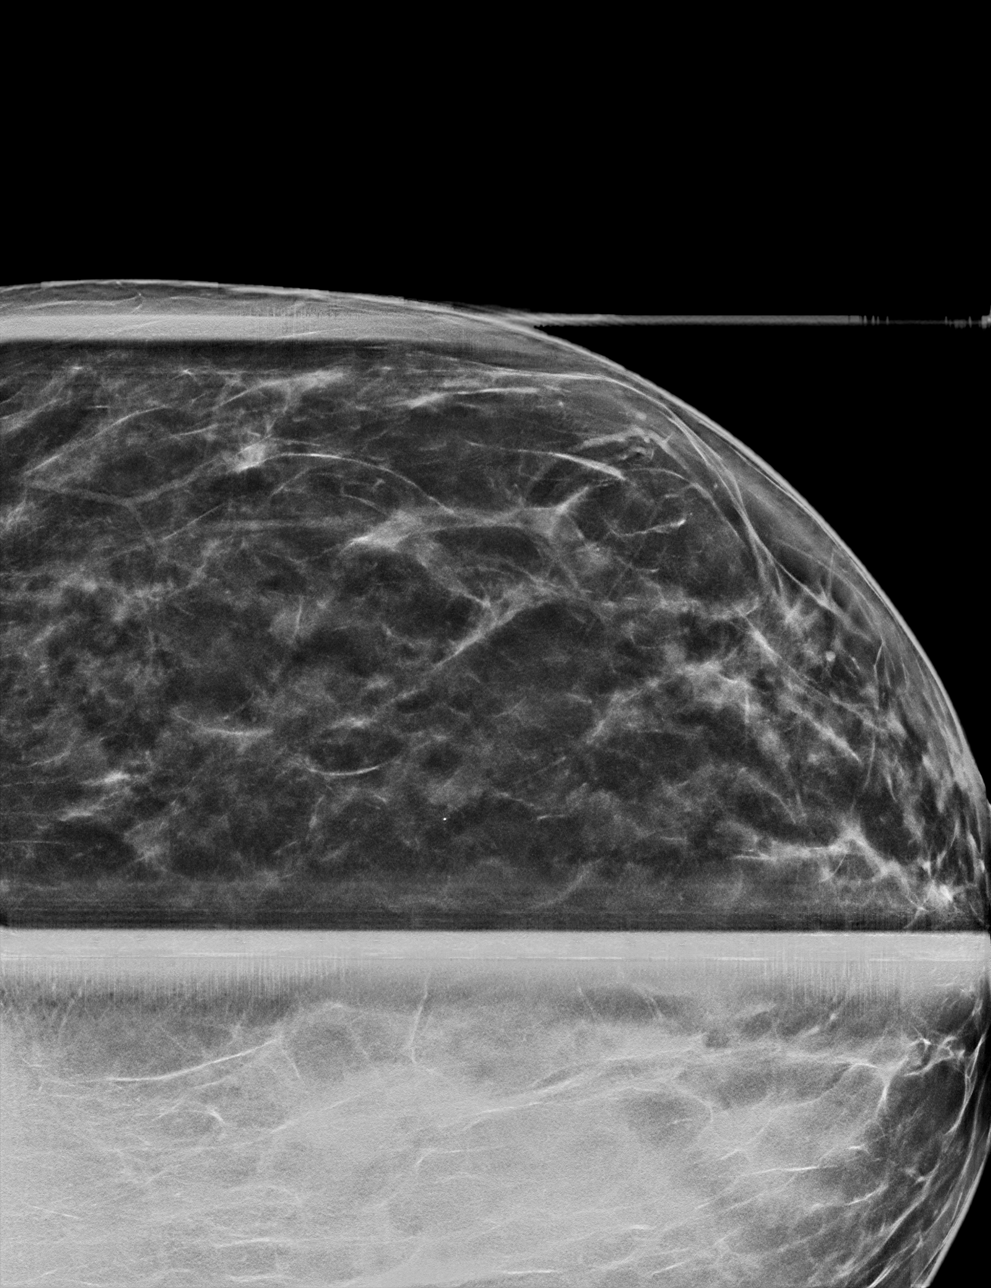

[L MLO synth-2D]
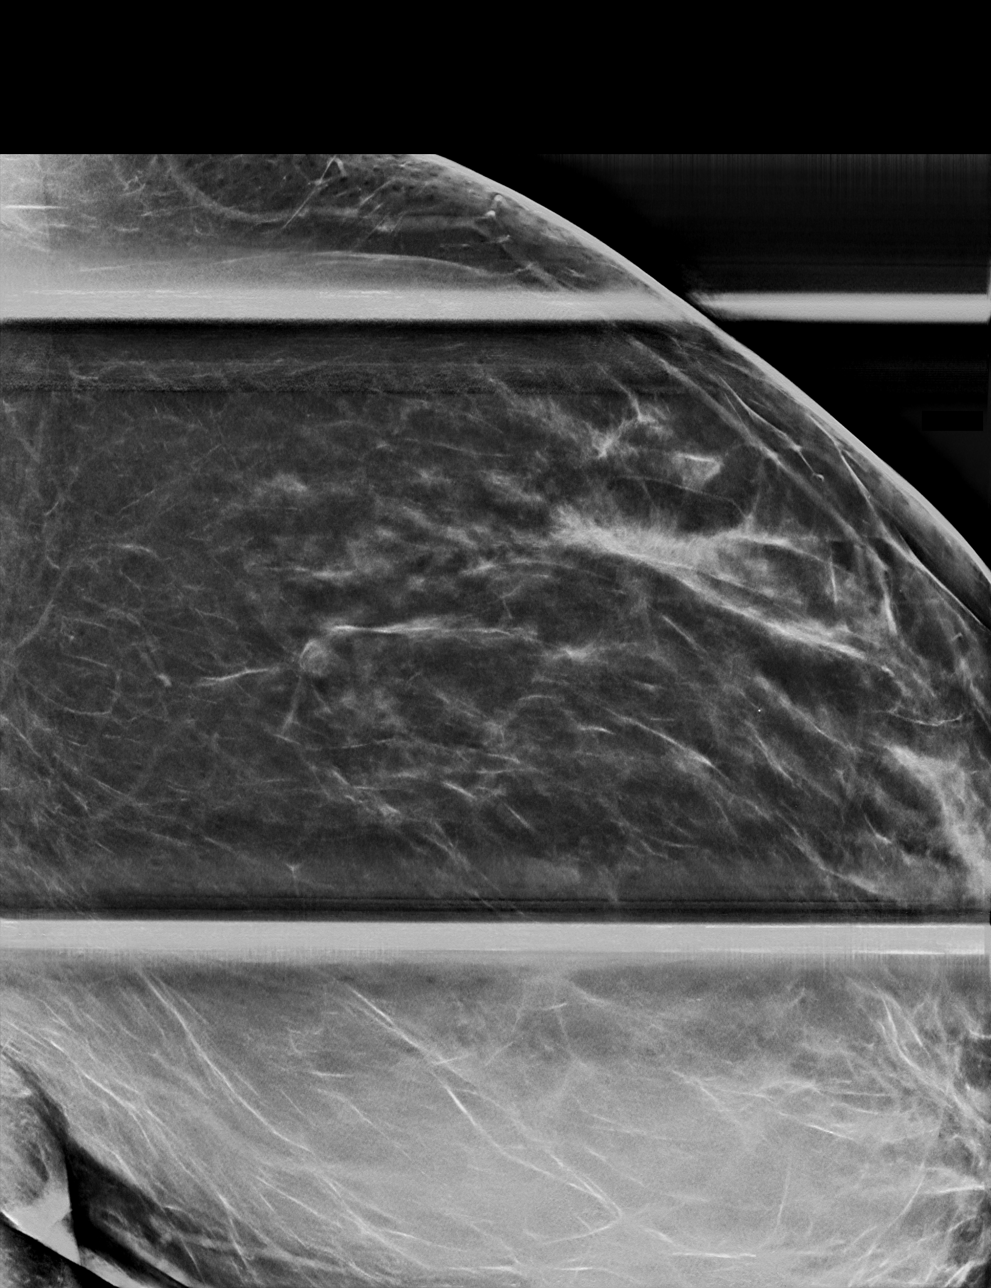

[L MLO tomo · tomo slice 43/86.0]
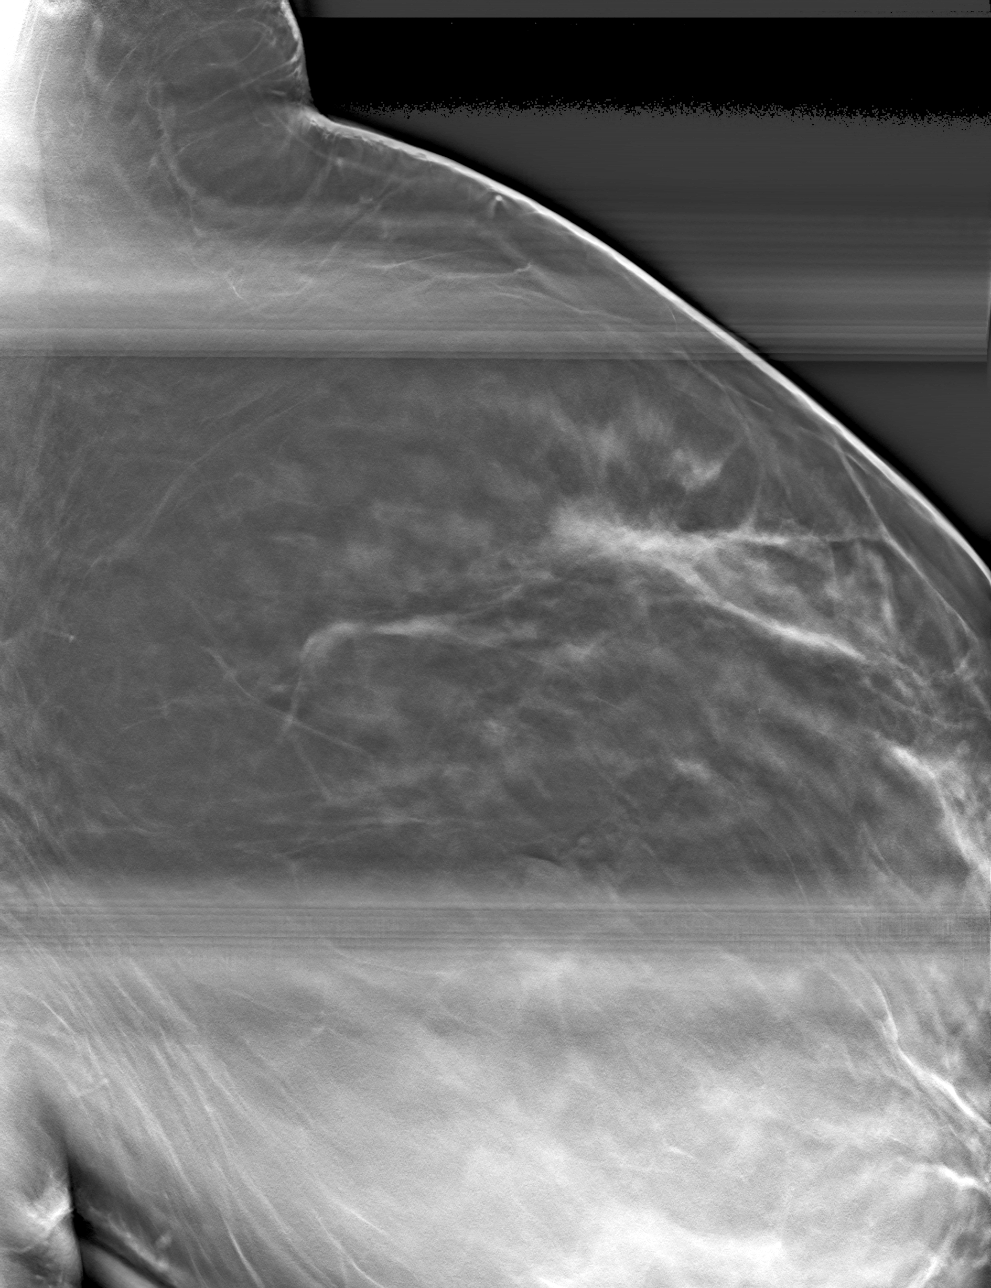

[L CC tomo · tomo slice 38/75.0]
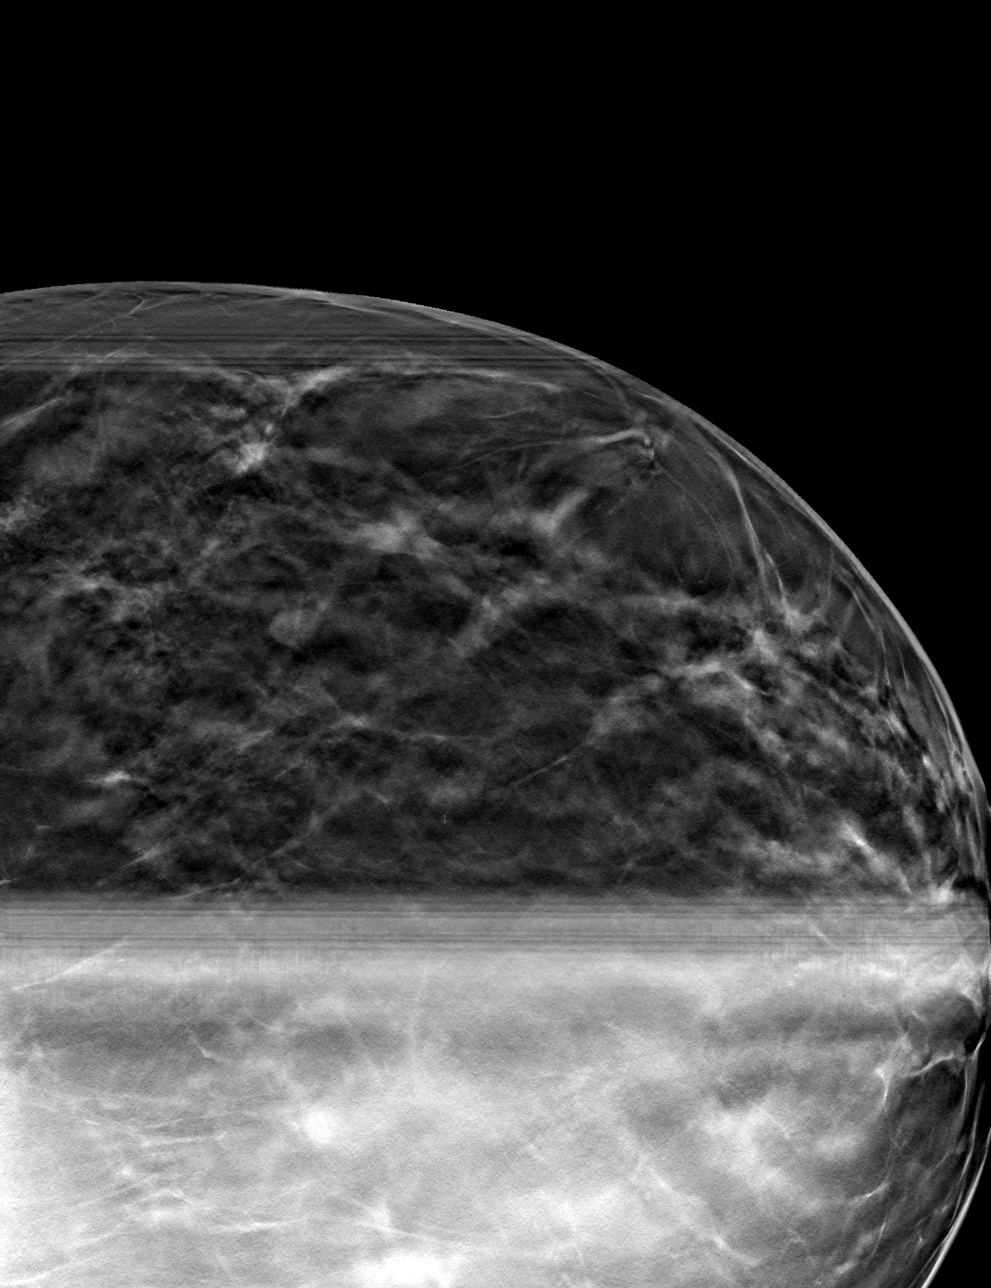

[4 of 12 positions shown; findings below may reference images not displayed]

ACR Breast Density Category c: The breast tissue is heterogeneously
dense, which may obscure small masses.
FINDINGS: Previously described, possible asymmetry in the upper outer left
breast posteriorly resolves into well dispersed fibroglandular
tissue on additional views. No suspicious findings are identified.
IMPRESSION: No mammographic evidence of malignancy.

RECOMMENDATION:
Screening mammogram in one year.(Code:8M-5-DZV)

I have discussed the findings and recommendations with the patient.
If applicable, a reminder letter will be sent to the patient
regarding the next appointment.

BI-RADS CATEGORY  1: Negative.

## 2023-09-11 ENCOUNTER — Ambulatory Visit: Payer: Self-pay | Admitting: Family Medicine

## 2023-09-11 NOTE — Telephone Encounter (Signed)
 Pt had appt 09/13/2023  Per chart this appt has been canceled-FYI

## 2023-09-11 NOTE — Telephone Encounter (Signed)
  Chief Complaint: sinus pressure Symptoms: facial pressure, pain, nasal drainage Frequency: began today Pertinent Negatives: Patient denies fever, cough, SOB Disposition: [] ED /[] Urgent Care (no appt availability in office) / [] Appointment(In office/virtual)/ []  [facility]/ [x] Home Care/ [] Refused Recommended Disposition /[] [facility]/ []  Follow-up with PCP Additional Notes: Patient calls reporting sinus pressure, nasal drainage that began this morning. States that she has this occur often with weather changes. Denies color to nasal drainage. Per protocol, home care appropriate. Care advice reviewed, patient verbalized understanding and denies further questions at this time. Advised to monitor for worsening signs and symptoms and call back with any changes. Patient did state she may be seen at the UC in [place] if symptoms worsen. Alerting PCP for review.    Copied from CRM [id number]. Topic: Clinical - Red Word Triage >> Sep 11, 2023  9:16 AM Turkey A wrote: [facility] that prompted transfer to Nurse Triage: Swollen eyes, and puffiness around nose. Has tension headache 1-10;1 pressure in face Reason for Disposition  Headache  Face pain present < 24 hours  Answer Assessment - Initial Assessment Questions 1. ONSET: "When did the pain start?" (e.g., minutes, hours, days)     This morning 2. ONSET: "Does the pain come and go, or has it been constant since it started?" (e.g., constant, intermittent, fleeting)     constant 3. SEVERITY: "How bad is the pain?"   (Scale 1-10; mild, moderate or severe)   - MILD (1-3): doesn't interfere with normal activities    - MODERATE (4-7): interferes with normal activities or awakens from sleep    - SEVERE (8-10): excruciating pain, unable to do any normal activities      1/10 4. LOCATION: "Where does it hurt?"      Hurts around eyes and eyebrows 5. RASH: "Is there any redness, rash, or swelling of the face?"     Puffy around nose,  eyes 6. FEVER: "Do you have a fever?" If Yes, ask: "What is it, how was it measured, and when did it start?"      Denies 7. OTHER SYMPTOMS: "Do you have any other symptoms?" (e.g., fever, toothache, nasal discharge, nasal congestion, clicking sensation in jaw joint)     Sinus pressure, nasal congestion 8. PREGNANCY: "Is there any chance you are pregnant?" "When was your last menstrual period?"     NA  Protocols used: Face Pain-A-AH, Headache-A-AH

## 2023-09-13 ENCOUNTER — Ambulatory Visit: Payer: 59 | Admitting: Family Medicine

## 2023-10-07 DIAGNOSIS — D2362 Other benign neoplasm of skin of left upper limb, including shoulder: Secondary | ICD-10-CM | POA: Diagnosis not present

## 2023-10-07 DIAGNOSIS — D2372 Other benign neoplasm of skin of left lower limb, including hip: Secondary | ICD-10-CM | POA: Diagnosis not present

## 2023-10-07 DIAGNOSIS — L910 Hypertrophic scar: Secondary | ICD-10-CM | POA: Diagnosis not present

## 2023-10-07 DIAGNOSIS — L821 Other seborrheic keratosis: Secondary | ICD-10-CM | POA: Diagnosis not present

## 2023-10-10 DIAGNOSIS — S92322A Displaced fracture of second metatarsal bone, left foot, initial encounter for closed fracture: Secondary | ICD-10-CM | POA: Diagnosis not present

## 2023-10-10 DIAGNOSIS — W19XXXA Unspecified fall, initial encounter: Secondary | ICD-10-CM | POA: Diagnosis not present

## 2023-10-10 DIAGNOSIS — S92342A Displaced fracture of fourth metatarsal bone, left foot, initial encounter for closed fracture: Secondary | ICD-10-CM | POA: Diagnosis not present

## 2023-10-10 DIAGNOSIS — S92345A Nondisplaced fracture of fourth metatarsal bone, left foot, initial encounter for closed fracture: Secondary | ICD-10-CM | POA: Diagnosis not present

## 2023-10-10 DIAGNOSIS — S92302A Fracture of unspecified metatarsal bone(s), left foot, initial encounter for closed fracture: Secondary | ICD-10-CM | POA: Diagnosis not present

## 2023-10-10 DIAGNOSIS — S92335A Nondisplaced fracture of third metatarsal bone, left foot, initial encounter for closed fracture: Secondary | ICD-10-CM | POA: Diagnosis not present

## 2023-10-10 DIAGNOSIS — S92332A Displaced fracture of third metatarsal bone, left foot, initial encounter for closed fracture: Secondary | ICD-10-CM | POA: Diagnosis not present

## 2023-10-10 DIAGNOSIS — S92325A Nondisplaced fracture of second metatarsal bone, left foot, initial encounter for closed fracture: Secondary | ICD-10-CM | POA: Diagnosis not present

## 2023-10-11 DIAGNOSIS — S92342A Displaced fracture of fourth metatarsal bone, left foot, initial encounter for closed fracture: Secondary | ICD-10-CM | POA: Diagnosis not present

## 2023-10-11 DIAGNOSIS — S92332A Displaced fracture of third metatarsal bone, left foot, initial encounter for closed fracture: Secondary | ICD-10-CM | POA: Diagnosis not present

## 2023-10-11 DIAGNOSIS — S92902A Unspecified fracture of left foot, initial encounter for closed fracture: Secondary | ICD-10-CM | POA: Diagnosis not present

## 2023-10-11 DIAGNOSIS — S92322A Displaced fracture of second metatarsal bone, left foot, initial encounter for closed fracture: Secondary | ICD-10-CM | POA: Diagnosis not present

## 2023-10-11 DIAGNOSIS — S92302A Fracture of unspecified metatarsal bone(s), left foot, initial encounter for closed fracture: Secondary | ICD-10-CM | POA: Diagnosis not present

## 2023-10-14 DIAGNOSIS — S92302A Fracture of unspecified metatarsal bone(s), left foot, initial encounter for closed fracture: Secondary | ICD-10-CM | POA: Diagnosis not present

## 2023-11-04 DIAGNOSIS — S92302D Fracture of unspecified metatarsal bone(s), left foot, subsequent encounter for fracture with routine healing: Secondary | ICD-10-CM | POA: Diagnosis not present

## 2023-12-06 DIAGNOSIS — S92302D Fracture of unspecified metatarsal bone(s), left foot, subsequent encounter for fracture with routine healing: Secondary | ICD-10-CM | POA: Diagnosis not present

## 2024-01-10 DIAGNOSIS — S92302D Fracture of unspecified metatarsal bone(s), left foot, subsequent encounter for fracture with routine healing: Secondary | ICD-10-CM | POA: Diagnosis not present

## 2024-02-21 DIAGNOSIS — S92302D Fracture of unspecified metatarsal bone(s), left foot, subsequent encounter for fracture with routine healing: Secondary | ICD-10-CM | POA: Diagnosis not present

## 2024-02-21 DIAGNOSIS — R2681 Unsteadiness on feet: Secondary | ICD-10-CM | POA: Diagnosis not present

## 2024-02-27 DIAGNOSIS — N926 Irregular menstruation, unspecified: Secondary | ICD-10-CM | POA: Diagnosis not present

## 2024-02-27 DIAGNOSIS — Z13228 Encounter for screening for other metabolic disorders: Secondary | ICD-10-CM | POA: Diagnosis not present

## 2024-02-27 DIAGNOSIS — Z01419 Encounter for gynecological examination (general) (routine) without abnormal findings: Secondary | ICD-10-CM | POA: Diagnosis not present

## 2024-02-27 DIAGNOSIS — Z6835 Body mass index (BMI) 35.0-35.9, adult: Secondary | ICD-10-CM | POA: Diagnosis not present

## 2024-02-27 DIAGNOSIS — Z1231 Encounter for screening mammogram for malignant neoplasm of breast: Secondary | ICD-10-CM | POA: Diagnosis not present

## 2024-03-06 DIAGNOSIS — Z13228 Encounter for screening for other metabolic disorders: Secondary | ICD-10-CM | POA: Diagnosis not present

## 2024-03-06 DIAGNOSIS — E669 Obesity, unspecified: Secondary | ICD-10-CM | POA: Diagnosis not present

## 2024-03-06 DIAGNOSIS — Z13220 Encounter for screening for lipoid disorders: Secondary | ICD-10-CM | POA: Diagnosis not present

## 2024-03-06 DIAGNOSIS — E559 Vitamin D deficiency, unspecified: Secondary | ICD-10-CM | POA: Diagnosis not present

## 2024-03-06 DIAGNOSIS — Z131 Encounter for screening for diabetes mellitus: Secondary | ICD-10-CM | POA: Diagnosis not present

## 2024-03-06 DIAGNOSIS — Z130 Encounter for screening for diseases of the blood and blood-forming organs and certain disorders involving the immune mechanism: Secondary | ICD-10-CM | POA: Diagnosis not present

## 2024-03-06 DIAGNOSIS — R5382 Chronic fatigue, unspecified: Secondary | ICD-10-CM | POA: Diagnosis not present

## 2024-03-12 DIAGNOSIS — N939 Abnormal uterine and vaginal bleeding, unspecified: Secondary | ICD-10-CM | POA: Diagnosis not present

## 2024-03-13 DIAGNOSIS — R2681 Unsteadiness on feet: Secondary | ICD-10-CM | POA: Diagnosis not present

## 2024-03-13 DIAGNOSIS — R29898 Other symptoms and signs involving the musculoskeletal system: Secondary | ICD-10-CM | POA: Diagnosis not present

## 2024-03-13 DIAGNOSIS — R6889 Other general symptoms and signs: Secondary | ICD-10-CM | POA: Diagnosis not present

## 2024-03-15 ENCOUNTER — Other Ambulatory Visit: Payer: Self-pay | Admitting: Family Medicine

## 2024-03-15 DIAGNOSIS — I10 Essential (primary) hypertension: Secondary | ICD-10-CM

## 2024-03-26 ENCOUNTER — Other Ambulatory Visit: Payer: Self-pay | Admitting: Family Medicine

## 2024-03-26 DIAGNOSIS — I10 Essential (primary) hypertension: Secondary | ICD-10-CM

## 2024-03-26 NOTE — Telephone Encounter (Signed)
 Called patient to make an appointment, she will call back tomorrow notes she is at work today

## 2024-03-26 NOTE — Telephone Encounter (Signed)
 Copied from CRM [id number]. Topic: Clinical - Medication Refill >> Mar 26, 2024 10:58 AM Vena HERO wrote: Medication: losartan -hydrochlorothiazide (HYZAAR) 50-12.5 MG tablet  Has the patient contacted their pharmacy? Yes (Agent: If no, request that the patient contact the pharmacy for the refill. If patient does not wish to contact the pharmacy document the reason why and proceed with request.) (Agent: If yes, when and what did the pharmacy advise?) will fax over request  This is the patient's preferred pharmacy:  [facility] [address], [place] - [address] [place] [address] Phone: [phone] Fax: [phone]   Is this the correct pharmacy for this prescription? Yes If no, delete pharmacy and type the correct one.   Has the prescription been filled recently? No  Is the patient out of the medication? Yes  Has the patient been seen for an appointment in the last year OR does the patient have an upcoming appointment? Yes  Can we respond through MyChart? No  Agent: Please be advised that Rx refills may take up to 3 business days. We ask that you follow-up with your pharmacy.

## 2024-03-27 NOTE — Telephone Encounter (Signed)
 Patient has an appointment on 10/17 but will be out of Losartan -hydrochlorothiazide 50-12.5mg  tablets before appointment. Patient is wondering if she could receive a refill before appointment? Per jacquline a refill has been sent to her pharmacy. Called patient to inform her of the medication being at pharmacy and she verbalized understanding.

## 2024-03-27 NOTE — Telephone Encounter (Signed)
 Copied from CRM [id number]. Topic: Clinical - Prescription Issue >> Mar 27, 2024  9:47 AM Robinson H wrote: Reason for CRM: Patient scheduled an appointment for 10/17 for medication refill but out of the losartan -hydrochlorothiazide (HYZAAR) 50-12.5 MG tablet and needs something to hold her over until appointment, wants an update.  Dama [phone]

## 2024-04-10 DIAGNOSIS — R2681 Unsteadiness on feet: Secondary | ICD-10-CM | POA: Diagnosis not present

## 2024-04-10 DIAGNOSIS — R29898 Other symptoms and signs involving the musculoskeletal system: Secondary | ICD-10-CM | POA: Diagnosis not present

## 2024-04-10 DIAGNOSIS — R6889 Other general symptoms and signs: Secondary | ICD-10-CM | POA: Diagnosis not present

## 2024-04-22 ENCOUNTER — Other Ambulatory Visit: Payer: Self-pay | Admitting: Family Medicine

## 2024-04-22 DIAGNOSIS — I10 Essential (primary) hypertension: Secondary | ICD-10-CM

## 2024-05-01 ENCOUNTER — Ambulatory Visit: Admitting: Family Medicine

## 2024-05-01 VITALS — BP 128/86 | HR 72 | Temp 98.1°F | Resp 15 | Ht 69.0 in | Wt 238.0 lb

## 2024-05-01 DIAGNOSIS — Z889 Allergy status to unspecified drugs, medicaments and biological substances status: Secondary | ICD-10-CM | POA: Diagnosis not present

## 2024-05-01 DIAGNOSIS — I10 Essential (primary) hypertension: Secondary | ICD-10-CM

## 2024-05-01 DIAGNOSIS — F411 Generalized anxiety disorder: Secondary | ICD-10-CM

## 2024-05-01 DIAGNOSIS — Z131 Encounter for screening for diabetes mellitus: Secondary | ICD-10-CM

## 2024-05-01 DIAGNOSIS — F410 Panic disorder [episodic paroxysmal anxiety] without agoraphobia: Secondary | ICD-10-CM | POA: Diagnosis not present

## 2024-05-01 DIAGNOSIS — E7800 Pure hypercholesterolemia, unspecified: Secondary | ICD-10-CM | POA: Diagnosis not present

## 2024-05-01 DIAGNOSIS — R140 Abdominal distension (gaseous): Secondary | ICD-10-CM

## 2024-05-01 DIAGNOSIS — L299 Pruritus, unspecified: Secondary | ICD-10-CM

## 2024-05-01 LAB — LIPID PANEL
Cholesterol: 169 mg/dL (ref 0–200)
HDL: 50 mg/dL (ref >39.00)
LDL Cholesterol: 107 mg/dL — ABNORMAL HIGH (ref 0–99)
NonHDL: 118.85
Total CHOL/HDL Ratio: 3
Triglycerides: 57 mg/dL (ref 0.0–149.0)
VLDL: 11.4 mg/dL (ref 0.0–40.0)

## 2024-05-01 LAB — COMPREHENSIVE METABOLIC PANEL WITH GFR
ALT: 11 U/L (ref 0–35)
AST: 14 U/L (ref 0–37)
Albumin: 4.1 g/dL (ref 3.5–5.2)
Alkaline Phosphatase: 65 U/L (ref 39–117)
BUN: 11 mg/dL (ref 6–23)
CO2: 29 meq/L (ref 19–32)
Calcium: 9.5 mg/dL (ref 8.4–10.5)
Chloride: 106 meq/L (ref 96–112)
Creatinine, Ser: 0.82 mg/dL (ref 0.40–1.20)
GFR: 81.59 mL/min (ref >60.00)
Glucose, Bld: 73 mg/dL (ref 70–99)
Potassium: 3.6 meq/L (ref 3.5–5.1)
Sodium: 142 meq/L (ref 135–145)
Total Bilirubin: 0.5 mg/dL (ref 0.2–1.2)
Total Protein: 7.2 g/dL (ref 6.0–8.3)

## 2024-05-01 LAB — HEMOGLOBIN A1C: Hgb A1c MFr Bld: 5.6 % (ref 4.6–6.5)

## 2024-05-01 MED ORDER — EPINEPHRINE 0.3 MG/0.3ML IJ SOAJ: 0.3000 mg | INTRAMUSCULAR | 0 refills | Status: AC | PRN

## 2024-05-01 MED ORDER — LOSARTAN POTASSIUM-HCTZ 50-12.5 MG PO TABS: 1.0000 | ORAL_TABLET | Freq: Every day | ORAL | 0 refills | Status: DC

## 2024-05-01 MED ORDER — ALPRAZOLAM 0.5 MG PO TABS: 0.5000 mg | ORAL_TABLET | Freq: Two times a day (BID) | ORAL | 0 refills | Status: AC | PRN

## 2024-05-01 NOTE — Progress Notes (Signed)
 Subjective:  Patient ID: Danielle Hawkins, female    DOB: [birth year]  Age: 53 y.o. MRN: [id number]  CC:  Chief Complaint  Patient presents with   Hypertension   Allergic Reaction    Patient has found that she is allergic to several things. She would like an epi pen.     HPI Donna Snooks presents for follow-up, last visit with me in August 2024 for physical at that time. 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   History of chronic anemia with fibroids, followed by gynecology, Dr. Horacio.  Iron supplement as needed with heavier cycles when discussed last year. AUB.  Recovering from multiple fractures in left foot in March. Still in PT. Treated by podiatry - Dr. Orland.   Hypertension: Treated with losartan  hydrochlorothiazide 50/12.5 mg daily.  Denies any medication side effects. Some weight gain with decreased activity with injury above.  Home readings: BP Readings from Last 3 Encounters:  05/01/24 128/86  07/16/23 (!) 135/92  03/13/23 134/72   Lab Results  Component Value Date   CREATININE 0.94 03/13/2023   Wt Readings from Last 3 Encounters:  05/01/24 238 lb (108 kg)  07/16/23 230 lb (104.3 kg)  05/31/23 230 lb (104.3 kg)     Situational anxiety Episodic use of alprazolam  in the past.  Had not tolerated sertraline and Lexapro due to side effects, difficulty with function and focus on those medications previously.  Infrequent use of alprazolam , controlled substance database reviewed.  #30 last filled on 09/10/2022.  Phentermine No. 30 on 03/06/2024 - did not continue - side effects.  Xanax  Once per month. Doing ok. No new side effects.   Request for EpiPen/allergies See allergy list, multiple allergies to medications, with prior hives. Concerned about possible gluten allergy - stomach bloated, or itchy all over with some breads, not all breads. Certain sweets cause itching.  No recent allergy visit - saw one in [place] . No food allergies noted in past - 15 years ago.  No known environmental allergies.  Had more severe reaction with red dye. Face swelling mouth swelling with hives, mouth swelling.    Hyperlipidemia: No current statin, mild LDL elevation in August of last year. Mother with stent in her 21's  Fasting today.  Had bloodwork few months ago for weight loss.did not tolerate phentermine. Would consider meeting with weight specialist.  The 10-year ASCVD risk score (Arnett DK, et al., 2019) is: 4.6%   Values used to calculate the score:     Age: 16 years     Clincally relevant sex: Female     Is Non-Hispanic African American: Yes     Diabetic: No     Tobacco smoker: No     Systolic Blood Pressure: 128 mmHg     Is BP treated: Yes     HDL Cholesterol: 45 mg/dL     Total Cholesterol: 168 mg/dL  Lab Results  Component Value Date   CHOL 179 03/13/2023   HDL 47.30 03/13/2023   LDLCALC 115 (H) 03/13/2023   TRIG 86.0 03/13/2023   CHOLHDL 4 03/13/2023   Lab Results  Component Value Date   ALT 8 03/13/2023   AST 11 03/13/2023   ALKPHOS 72 03/13/2023   BILITOT 0.4 03/13/2023    Nasal concern: Was advised by prior provider that had enlarged polyps in nose at a physical - August 2023 - told to try flonase. Denies snoring. No nasal congestion.  Would like recheck of nose today. Min congestion with allergies at times -  fall allergies, change of weather.   Health maintenance Declines pneumonia, flu, COVID, hepatitis B and shingles vaccines.  Had Hep B vaccine at prior job.  Immunization History  Administered Date(s) Administered   Dtap, Unspecified 01/26/1971, 04/03/1971, 06/14/1971, 11/25/1975   Influenza-Unspecified 04/07/2018   MMR 06/10/1974   Measles 11/08/1971, 08/04/1987   PFIZER Comirnaty(Gray Top)Covid-19 Tri-Sucrose Vaccine 01/10/2021, 01/31/2021   Polio, Unspecified 01/26/1971, 04/03/1971, 04/25/1971, 06/03/1974, 11/25/1975, 02/04/1976   Rubella 11/08/1971   Td 12/07/1993   Tdap 08/09/2016   Lab Results  Component Value Date    HGBA1C 5.2 03/13/2023     History  Patient Active Problem List   Diagnosis Date Noted   On home oxygen  therapy 06/03/2020   Dyspnea on exertion 06/03/2020   Blood in urine 06/03/2020   Hypertensive disorder 06/03/2020   Allergy    Anemia    Anxiety    Blood transfusion without reported diagnosis    Encounter for blood transfusion    Fibroid    HSV infection    Hypertension    Renal disorder    COVID-19 05/26/2020   Essential hypertension 06/04/2016   Acute non-recurrent sinusitis 06/04/2016   Past Medical History:  Diagnosis Date   Acute non-recurrent sinusitis 06/04/2016   Allergy    Anemia    Anxiety    Blood in urine 06/03/2020   Blood transfusion without reported diagnosis    COVID-19    Dyspnea on exertion 06/03/2020   Encounter for blood transfusion    Essential hypertension 06/04/2016   Fibroid    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   Hypertension    Renal disorder    kidney stones   Past Surgical History:  Procedure Laterality Date   GYNECOLOGIC CRYOSURGERY  early 20's   keloid removal     LITHOTRIPSY     Allergies  Allergen Reactions   Benzalkonium Chloride Other (See Comments)    Burning   Diflucan [Fluconazole] Hives, Itching and Swelling   Iopamidol Itching and Swelling   Neosporin [Neomycin-Bacitracin Zn-Polymyx] Other (See Comments)    burning   Niacin And Related Itching   Other Hives, Itching and Other (See Comments)    IV dye Allergic to metal. Burning reaction    Oxycodone-Acetaminophen Itching and Hives   Percocet [Oxycodone-Acetaminophen] Hives and Itching   Red Dye #40 (Allura Red) Hives and Itching   Tramadol Hives and Itching   Neomycin-Polymyxin-Pramoxine Other (See Comments)    Skin Burning   Niacin Itching   Boric Acid Other (See Comments)    boric acid   Iodinated Contrast Media Itching and Hives   Latex Itching    Irration on site of contact   Prior to Admission medications   Medication  Sig Start Date End Date Taking? Authorizing Provider  ALPRAZolam  (XANAX ) 0.5 MG tablet Take 1 tablet (0.5 mg total) by mouth 2 (two) times daily as needed. for anxiety 12/29/20  Yes Levora Reyes SAUNDERS, MD  Biotin 1 MG CAPS Take 1 tablet by mouth daily.   Yes [provider]  Ferrous Sulfate 140 (45 Fe) MG TBCR Take 1 tablet by mouth daily.   Yes [provider]  losartan -hydrochlorothiazide (HYZAAR) 50-12.5 MG tablet Take 1 tablet by mouth once daily 04/22/24  Yes Levora Reyes SAUNDERS, MD  Vitamin D, Ergocalciferol, (DRISDOL) 1.25 MG (50000 UNIT) CAPS capsule Take 50,000 Units by mouth once a week. 04/14/24  Yes [provider]   Social History   Socioeconomic History  Marital status: Single    Spouse name: Not on file   Number of children: Not on file   Years of education: Not on file   Highest education level: Not on file  Occupational History   Not on file  Tobacco Use   Smoking status: Former    Types: Cigarettes   Smokeless tobacco: Never   Tobacco comments:    socially  Vaping Use   Vaping status: Never Used  Substance and Sexual Activity   Alcohol use: Not Currently    Comment: Rare   Drug use: No   Sexual activity: Yes    Birth control/protection: Pill  Other Topics Concern   Not on file  Social History Narrative   Not on file   Social Drivers of Health   Financial Resource Strain: Low Risk  (07/08/2019)   Received from [facility]   Overall Financial Resource Strain (CARDIA)    Difficulty of Paying Living Expenses: Not hard at all  Food Insecurity: No Food Insecurity (03/02/2022)   Received from [facility]   Hunger Vital Sign    Within the past 12 months, you worried that your food would run out before you got the money to buy more.: Never true    Within the past 12 months, the food you bought just didn't last and you didn't have money to get more.: Never true  Transportation Needs: No Transportation Needs (07/08/2019)    Received from [facility] - Transportation    In the past 12 months, has lack of transportation kept you from medical appointments or from getting medications?: No    Lack of Transportation (Non-Medical): No  Physical Activity: Inactive (07/08/2019)   Received from [facility]   Exercise Vital Sign    On average, how many days per week do you engage in moderate to strenuous exercise (like a brisk walk)?: 0 days    On average, how many minutes do you engage in exercise at this level?: 0 min  Stress: Stress Concern Present (07/08/2019)   Received from [facility] - Occupational Stress Questionnaire    Feeling of Stress : Very much  Social Connections: Unknown (02/20/2022)   Received from [facility]   Social Network    Social Network: Not on file  Intimate Partner Violence: Unknown (02/20/2022)   Received from [facility]   HITS    Physically Hurt: Not on file    Insult or Talk Down To: Not on file    Threaten Physical Harm: Not on file    Scream or Curse: Not on file    Review of Systems  Constitutional:  Negative for fatigue and unexpected weight change.  Respiratory:  Negative for chest tightness and shortness of breath.   Cardiovascular:  Negative for chest pain, palpitations and leg swelling.  Gastrointestinal:  Negative for abdominal pain and blood in stool.  Neurological:  Negative for dizziness, syncope, light-headedness and headaches.     Objective:   Vitals:   05/01/24 1013  BP: 128/86  Pulse: 72  Resp: 15  Temp: 98.1 F (36.7 C)  TempSrc: Temporal  SpO2: 97%  Weight: 238 lb (108 kg)  Height: 5' 9 (1.753 m)     Physical Exam Vitals reviewed.  Constitutional:      Appearance: Normal appearance. She is well-developed.  HENT:     Head: Normocephalic and atraumatic.     Nose:  Comments: Prominent, slightly edematous turbinates.  No active  discharge or bleeding, septum appears normal. Eyes:     Conjunctiva/sclera: Conjunctivae normal.     Pupils: Pupils are equal, round, and reactive to light.  Neck:     Vascular: No carotid bruit.  Cardiovascular:     Rate and Rhythm: Normal rate and regular rhythm.     Heart sounds: Normal heart sounds.  Pulmonary:     Effort: Pulmonary effort is normal.     Breath sounds: Normal breath sounds.  Abdominal:     General: There is no distension.     Palpations: Abdomen is soft. There is no pulsatile mass.  Musculoskeletal:     Right lower leg: No edema.     Left lower leg: No edema.  Skin:    General: Skin is warm and dry.  Neurological:     Mental Status: She is alert and oriented to person, place, and time.  Psychiatric:        Mood and Affect: Mood normal.        Behavior: Behavior normal.     Assessment & Plan:  Makeda Peeks is a 53 y.o. female . History of allergic reaction - Plan: Ambulatory referral to Allergy, EPINEPHrine (EPIPEN 2-PAK) 0.3 mg/0.3 mL IJ SOAJ injection  - Given reported reaction to red dye previously with mouth, face swelling, that would be more concerning for possible anaphylaxis or severe reaction then pruritus/urticaria with other known allergies.  I did recommend she follow-up with allergist for further testing or discussion of treatments.  Of note she was advised that she had nasal polyps on prior exam, minimal allergy symptoms at this time.  Over-the-counter Flonase as option and can also discussed with allergist.  ER precautions given if she does require use of EpiPen and potential side effects, risks of that medication were discussed.  Essential hypertension - Plan: losartan -hydrochlorothiazide (HYZAAR) 50-12.5 MG tablet, Comprehensive metabolic panel with GFR  - Stable on current regimen, check labs, continue same.  Anxiety state - Plan: ALPRAZolam  (XANAX ) 0.5 MG tablet Panic attacks - Plan: ALPRAZolam  (XANAX ) 0.5 MG tablet  - Stable with very  infrequent use of alprazolam , no concerns, will continue same regimen for now.  If increased need or worsening anxiety symptoms could consider different daily medication than prior SSRIs.  RTC precautions.  Bloating - Plan: Comprehensive metabolic panel with GFR, Celiac Disease Ab Screen w/Rfx  - Bloating, pruritus with certain foods, primarily breads.  Will check celiac disease panel, follow-up with allergist as above.  Pruritus - Plan: Ambulatory referral to Allergy, EPINEPHrine (EPIPEN 2-PAK) 0.3 mg/0.3 mL IJ SOAJ injection  - As above, allergist eval recommended.  Referral placed.  Screening for diabetes mellitus - Plan: Hemoglobin A1c  Elevated LDL cholesterol level - Plan: Lipid panel, Comprehensive metabolic panel with GFR  - Check labs.  She would like to avoid meds which I think would be reasonable initially.  Low ASCVD risk score but does have family history of coronary stents with her mother in either 44s or 58s.  Option of coronary calcium scoring may be helpful as well.  86-month follow-up for physical.    Meds ordered this encounter  Medications   losartan -hydrochlorothiazide (HYZAAR) 50-12.5 MG tablet    Sig: Take 1 tablet by mouth daily.    Dispense:  30 tablet    Refill:  0   ALPRAZolam  (XANAX ) 0.5 MG tablet    Sig: Take 1 tablet (0.5 mg total) by mouth 2 (two) times daily  as needed. for anxiety    Dispense:  30 tablet    Refill:  0   EPINEPHrine (EPIPEN 2-PAK) 0.3 mg/0.3 mL IJ SOAJ injection    Sig: Inject 0.3 mg into the muscle as needed for anaphylaxis.    Dispense:  1 each    Refill:  0   Patient Instructions  Thank you for coming in today. No change in medications at this time. If there are any concerns on your bloodwork, I will let you know. I would consider low dose cholesterol medicine with your family history, but let's check labs first. See info below on weight management resource.  Flonase is fine if you are having some nasal congestion, I will refer you to  allergist to decide on other testing, EpiPen was ordered for now.  Allergist can also perform exam and discuss concern of previous nasal polyps.  I did order some testing for celiac or gluten issues.  Please let me know if there are questions.  Can follow-up in 6 months for physical but happy to see you sooner if needed.Take care!   [facility]  [phone]     Signed,   Reyes Pines, MD [facility], [facility] 05/01/24 1:16 PM

## 2024-05-01 NOTE — Patient Instructions (Addendum)
 Thank you for coming in today. No change in medications at this time. If there are any concerns on your bloodwork, I will let you know. I would consider low dose cholesterol medicine with your family history, but let's check labs first. See info below on weight management resource.  Flonase is fine if you are having some nasal congestion, I will refer you to allergist to decide on other testing, EpiPen was ordered for now.  Allergist can also perform exam and discuss concern of previous nasal polyps.  I did order some testing for celiac or gluten issues.  Please let me know if there are questions.  Can follow-up in 6 months for physical but happy to see you sooner if needed.Take care!   [facility]  [phone]

## 2024-05-05 LAB — CELIAC DISEASE AB SCREEN W/RFX
Antigliadin Abs, IgA: 5 U (ref 0–19)
IgA/Immunoglobulin A, Serum: 200 mg/dL (ref 87–352)
Transglutaminase IgA: <2 U/mL (ref 0–3)

## 2024-05-07 ENCOUNTER — Ambulatory Visit: Payer: Self-pay | Admitting: Family Medicine

## 2024-05-14 NOTE — Progress Notes (Signed)
 Lab results have been discussed.   Verbalized understanding? Yes  Are there any questions? No

## 2024-06-19 ENCOUNTER — Encounter: Payer: Self-pay | Admitting: Internal Medicine

## 2024-06-19 ENCOUNTER — Ambulatory Visit: Admitting: Internal Medicine

## 2024-06-19 VITALS — BP 104/68 | HR 72 | Resp 16 | Ht 67.5 in | Wt 241.0 lb

## 2024-06-19 DIAGNOSIS — R140 Abdominal distension (gaseous): Secondary | ICD-10-CM | POA: Diagnosis not present

## 2024-06-19 DIAGNOSIS — L2389 Allergic contact dermatitis due to other agents: Secondary | ICD-10-CM

## 2024-06-19 DIAGNOSIS — L508 Other urticaria: Secondary | ICD-10-CM

## 2024-06-19 DIAGNOSIS — L299 Pruritus, unspecified: Secondary | ICD-10-CM

## 2024-06-19 MED ORDER — LEVOCETIRIZINE DIHYDROCHLORIDE 5 MG PO TABS: 5.0000 mg | ORAL_TABLET | Freq: Every evening | ORAL | 5 refills | Status: AC

## 2024-06-19 NOTE — Progress Notes (Signed)
 NEW PATIENT Date of Service/Encounter:  06/19/24 Referring provider: Levora Reyes SAUNDERS, MD Primary care provider: Levora Reyes SAUNDERS, MD  Subjective:  Danielle Hawkins is a 53 y.o. female  presenting today for evaluation of concern for food allergy  History obtained from: chart review and patient.   Discussed the use of AI scribe software for clinical note transcription with the patient, who gave verbal consent to proceed.  History of Present Illness Danielle Hawkins is a 53 year old who presents for evaluation of allergic reactions to various foods.  Food-induced allergic reactions - Pruritus and urticaria occur within five minutes of ingesting foods such as red dye, red wine, seasoning, cocktail sauce, pizza, cereal, some chocolates, processed sweets, some breads, and nuts. - Carries Benadryl  for symptom relief; duration of symptoms is uncertain. - Severe pruritus with energy drinks. - Immediate pruritus, urticaria, and swelling with certain shrimp dishes, especially those with reddish seasoning; no reaction with fried shrimp. - Carries an EpiPen due to increasing severity of reactions.  Suspected nut allergy - Recent pruritus after consuming walnuts in homemade trail mix, despite previous tolerance. - No reaction to peanut M&M's, but pruritus occurs with plain M&M's.  Gluten and bread sensitivity - Negative celiac testing. - Bloating occurs with some breads but not all. - Avoids cereal due to pruritus; [employer] brand cereal causes less pruritus. - No issues with cheese consumption.  Seasonal allergic rhinitis - Symptoms include rhinorrhea, nasal congestion, and sinus issues. - Seasonal pattern has shifted from fall to spring and back to fall over the years. - Does not regularly use medication; utilizes over-the-counter options as needed.  Contact dermatitis and skin sensitivities - History of childhood pruritus with Avon bubble bath; not formally diagnosed or managed. -  Requires hypoallergenic skin products without perfumes or dyes. - Avoids [employer] products due to prior reactions. - has not had patch testing   Culprit foods: red wine, red dye 40, seasonings, cocktail sauce, pizza, cereals, some chocolate, processed sweets, some bread, some nuts niacin, gluten   Had a celiac panel that was negative    Other allergy screening: Asthma: no Rhino conjunctivitis: no Food allergy: as above  Medication allergy: yes Hymenoptera allergy: no Urticaria: yes Eczema:no History of recurrent infections suggestive of immunodeficency: no Vaccinations are up to date.   Past Medical History: Past Medical History:  Diagnosis Date   Acute non-recurrent sinusitis 06/04/2016   Allergy    Anemia    Anxiety    Blood in urine 06/03/2020   Blood transfusion without reported diagnosis    COVID-19    Dyspnea on exertion 06/03/2020   Encounter for blood transfusion    Essential hypertension 06/04/2016   Fibroid    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   Hypertension    Renal disorder    kidney stones   Medication List:  Current Outpatient Medications  Medication Sig Dispense Refill   ALPRAZolam  (XANAX ) 0.5 MG tablet Take 1 tablet (0.5 mg total) by mouth 2 (two) times daily as needed. for anxiety 30 tablet 0   Biotin 1 MG CAPS Take 1 tablet by mouth daily.     clobetasol ointment (TEMOVATE) 0.05 % APPLY A THIN LAYER TO THE AFFECTED AREA(S) BY TOPICAL ROUTE 2 TIMES PER DAY as needed     EPINEPHrine (EPIPEN 2-PAK) 0.3 mg/0.3 mL IJ SOAJ injection Inject 0.3 mg into the muscle as needed for anaphylaxis. 1 each 0   Ferrous Sulfate 140 (  45 Fe) MG TBCR Take 1 tablet by mouth daily.     levocetirizine (XYZAL ) 5 MG tablet Take 1 tablet (5 mg total) by mouth every evening. 30 tablet 5   losartan -hydrochlorothiazide (HYZAAR) 50-12.5 MG tablet Take 1 tablet by mouth daily. 30 tablet 0   Vitamin D, Ergocalciferol, (DRISDOL) 1.25 MG (50000 UNIT)  CAPS capsule Take 50,000 Units by mouth once a week.     No current facility-administered medications for this visit.   Known Allergies:  Allergies  Allergen Reactions   Benzalkonium Chloride Other (See Comments)    Burning   Diflucan [Fluconazole] Hives, Itching and Swelling   Iopamidol Itching and Swelling   Neosporin [Neomycin-Bacitracin Zn-Polymyx] Other (See Comments)    burning   Niacin And Related Itching   Other Hives, Itching and Other (See Comments)    IV dye Allergic to metal. Burning reaction    Oxycodone-Acetaminophen Itching and Hives   Percocet [Oxycodone-Acetaminophen] Hives and Itching   Red Dye #40 (Allura Red) Hives and Itching   Tramadol Hives and Itching   Neomycin-Polymyxin-Pramoxine Other (See Comments)    Skin Burning   Niacin Itching   Boric Acid Other (See Comments)    boric acid   Iodinated Contrast Media Itching and Hives   Latex Itching    Irration on site of contact   Past Surgical History: Past Surgical History:  Procedure Laterality Date   GYNECOLOGIC CRYOSURGERY  early 20's   keloid removal     LITHOTRIPSY     Family History: Family History  Problem Relation Age of Onset   Hypertension Mother    Hyperlipidemia Mother    Diabetes Mother    Heart disease Mother    Hypertension Father    Allergic rhinitis Brother    Breast cancer Maternal Aunt 35   Colon polyps Maternal Aunt    Colon cancer Maternal Aunt    Asthma Nephew    Asthma Niece    Esophageal cancer Neg Hx    Rectal cancer Neg Hx    Stomach cancer Neg Hx    Eczema Neg Hx    Immunodeficiency Neg Hx    Social History: [place] lives [facility], carpet throughout, electric heat, window cooling, no tobacco exposure, works in [employer]   ROS:  All other systems negative except as noted per HPI.  Objective:  Blood pressure 104/68, pulse 72, resp. rate 16, height 5' 7.5 (1.715 m), weight 241 lb (109.3 kg), SpO2 97%. Body mass index is 37.19  kg/m. Physical Exam:  General Appearance:  Alert, cooperative, no distress, appears stated age  Head:  Normocephalic, without obvious abnormality, atraumatic  Eyes:  Conjunctiva clear, EOM's intact  Ears EACs normal bilaterally  Nose: Nares normal, normal mucosa, no visible anterior polyps, and septum midline  Throat: Lips, tongue normal; teeth and gums normal, normal posterior oropharynx  Neck: Supple, symmetrical  Lungs:   clear to auscultation bilaterally, Respirations unlabored, no coughing  Heart:  regular rate and rhythm and no murmur, Appears well perfused  Extremities: No edema  Skin: Skin color, texture, turgor normal and no rashes or lesions on visualized portions of skin  Neurologic: No gross deficits   Diagnostics: None done   Labs:  Lab Orders  No laboratory test(s) ordered today     Assessment and Plan  Assessment and Plan Assessment & Plan Food allergies with immediate hypersensitivity reactions Chronic food allergies with immediate hypersensitivity reactions to various foods. Symptoms include itching and hives, relieved by  Benadryl . Differential includes food intolerance versus true allergy. Mugwort celery mustard spice syndrome could be a possibility as well. Risk stratification needed for life-threatening reactions. EpiPen available for severe reactions. - return for food allergy testing (1-72)   -hold all antihistamines for 3 days prior  - Provided written allergy action plan. - Instructed to avoid foods causing hives and itching. - Advised to discontinue Benadryl  and switch to Xyzal  or Zyrtec. - Educated on EpiPen use for severe reactions.  Seasonal allergic rhinitis Deviated septum  Eustachian tube dysfunction on right  Symptoms of runny nose, nasal congestion, and sinus congestion, previously in spring, now in fall. Symptoms have not been adequately managed with over-the-counter medications. - Prescribed Xyzal  5mg  daily  for allergy management. -  Recommended Flonase 1 spray per nostril twice daily - Educated on proper nasal spray technique.  Allergic contact dermatitis to fragrances and dyes Chronic allergic contact dermatitis managed by avoiding products with fragrances and dyes. - Discussed patch testing for contact allergens if interested.  Follow up: for food allergy testing (1-72)  Avoid all antihistamines for 3 days prior    This note in its entirety was forwarded to the Provider who requested this consultation.  Other:  none done   Thank you for your kind referral. I appreciate the opportunity to take part in Jenise's care. Please do not hesitate to contact me with questions.  Sincerely,  Thank you so much for letting me partake in your care today.  Don't hesitate to reach out if you have any additional concerns!  Hargis Springer, MD  [facility] [place], [place]

## 2024-06-19 NOTE — Patient Instructions (Signed)
 Food allergies with immediate hypersensitivity reactions Chronic food allergies with immediate hypersensitivity reactions to various foods. Symptoms include itching and hives, relieved by Benadryl . Differential includes food intolerance versus true allergy. Mugwort celery mustard spice syndrome could be a possibility as well. Risk stratification needed for life-threatening reactions. EpiPen available for severe reactions. - return for food allergy testing (1-72)   -hold all antihistamines for 3 days prior  - Provided written allergy action plan. - Instructed to avoid foods causing hives and itching. - Advised to discontinue Benadryl  and switch to Xyzal  or Zyrtec. - Educated on EpiPen use for severe reactions.  Seasonal allergic rhinitis Deviated septum  Eustachian tube dysfunction on right  Symptoms of runny nose, nasal congestion, and sinus congestion, previously in spring, now in fall. Symptoms have not been adequately managed with over-the-counter medications. - Prescribed Xyzal  5mg  daily  for allergy management. - Recommended Flonase 1 spray per nostril twice daily - Educated on proper nasal spray technique.  Allergic contact dermatitis to fragrances and dyes Chronic allergic contact dermatitis managed by avoiding products with fragrances and dyes. - Discussed patch testing for contact allergens if interested. - continue use of hypoallergenic products   Follow up: for food allergy testing (1-72)  Avoid all antihistamines for 3 days prior

## 2024-06-26 ENCOUNTER — Other Ambulatory Visit: Payer: Self-pay

## 2024-06-26 MED ORDER — FLUTICASONE PROPIONATE 50 MCG/ACT NA SUSP: NASAL | 5 refills | Status: AC

## 2024-07-01 ENCOUNTER — Other Ambulatory Visit: Payer: Self-pay | Admitting: Family Medicine

## 2024-07-01 DIAGNOSIS — I10 Essential (primary) hypertension: Secondary | ICD-10-CM

## 2024-07-31 ENCOUNTER — Ambulatory Visit: Admitting: Internal Medicine

## 2024-08-13 ENCOUNTER — Ambulatory Visit: Admitting: Allergy and Immunology

## 2024-08-14 ENCOUNTER — Ambulatory Visit: Admitting: Internal Medicine

## 2024-08-14 DIAGNOSIS — L508 Other urticaria: Secondary | ICD-10-CM

## 2024-08-14 NOTE — Patient Instructions (Addendum)
 Food allergies with immediate hypersensitivity reactions Chronic food allergies with immediate hypersensitivity reactions to various foods. Symptoms include itching and hives, relieved by Benadryl . Differential includes food intolerance versus true allergy. Mugwort celery mustard spice syndrome could be a possibility as well. Risk stratification needed for life-threatening reactions. EpiPen  available for severe reactions. Allergy test (08/14/24): positive to trout, shrimp, scallops, green pepper, grape, canteloupe, chocolate  Start avoidance measures  - Continue to carry epipen  and follow allergy action plan for any reactions - Start zyrtec 10mg  1-2 times daily to prevent symptoms    Seasonal allergic rhinitis Deviated septum  Eustachian tube dysfunction on right  Symptoms of runny nose, nasal congestion, and sinus congestion, previously in spring, now in fall. Symptoms have not been adequately managed with over-the-counter medications. - Start Zyrtec 10mg  daily  - Recommended Flonase  1 spray per nostril twice daily - Educated on proper nasal spray technique.  Allergic contact dermatitis to fragrances and dyes Chronic allergic contact dermatitis managed by avoiding products with fragrances and dyes. - Discussed patch testing for contact allergens if interested. - continue use of hypoallergenic products   Follow up: 6 months

## 2024-08-14 NOTE — Progress Notes (Signed)
 " Date of Service/Encounter:  08/14/24  Allergy testing appointment   Initial visit on 06/19/24, seen for urticaria and food allergy .  Please see that note for additional details.  Today reports for allergy diagnostic testing:    DIAGNOSTICS:  Skin Testing: Food allergy panel. Adequate positive and negative controls Results discussed with patient/family.  Food Adult Perc - 08/14/24 1400     Time Antigen Placed 1450    Allergen Manufacturer Jestine    Location Back    Number of allergen test 74     Control-buffer 50% Glycerol Negative    Control-Histamine 4+    1. Peanut Negative    2. Soybean Negative    3. Wheat Negative    4. Sesame Negative    5. Milk, Cow Negative    6. Casein Negative    7. Egg White, Chicken Negative    8. Shellfish Mix Negative    9. Fish Mix Negative    10. Cashew Negative    11. Walnut Food Negative    12. Almond Negative    13. Hazelnut Negative    14. Pecan Food Negative    15. Pistachio Negative    16. Brazil Nut Negative    17. Coconut Negative    18. Trout 2+    19. Tuna Negative    20. Salmon Negative    21. Flounder Negative    22. Codfish Negative    23. Shrimp 2+    24. Crab Negative    25. Lobster Negative    26. Oyster Negative    27. Scallops 2+    28. Oat  Negative    29. Rice Negative    30. Barley Negative    31. Rye  Negative    32. Hops Negative    33. Turkey Meat Negative    34. Chicken Meat Negative    35. Pork Negative    36. Beef Negative    37. Lamb Negative    38. Tomato Negative    39. White Potato Negative    40. Sweet Potato Negative    41. Pea, Green/English Negative    42. Navy Bean Negative    43. Green Beans Negative    44. Squash Negative    45. Green Pepper 2+    46. Mushrooms Negative    47. Onion Negative    48. Avocado Negative    49. Cabbage Negative    50. Carrots Negative    51. Celery Negative    52. Corn Negative    53. Cucumber Negative    54. Grape (White seedless) 2+    55.  Orange  Negative    56. Lemon Negative    57. Banana Negative    58. Apple Negative    59. Peach Negative    60. Strawberry Negative    61. Blueberry Negative    62. Cherry Negative    63. Cantaloupe 2+    64. Watermelon Negative    65. Pineapple Negative    66. Chocolate/Cacao Bean 2+    67. Cinnamon Negative    68. Nutmeg Negative    69. Ginger Negative    70. Garlic Negative    71. Pepper, Black Negative    72. Mustard Negative          Allergy testing results were read and interpreted by myself, documented by clinical staff.  Patient provided with copy of allergy testing along with avoidance measures when indicated.   Hargis Springer,  MD  [facility]        "

## 2024-10-21 ENCOUNTER — Encounter: Admitting: Family Medicine

## 2024-10-30 ENCOUNTER — Encounter: Admitting: Family Medicine
